# Patient Record
Sex: Female | Born: 1980
Health system: Southern US, Community
[De-identification: ages and names within clinical notes are randomized; demographics above are authoritative.]

## PROBLEM LIST (undated history)

## (undated) DIAGNOSIS — B977 Papillomavirus as the cause of diseases classified elsewhere: Secondary | ICD-10-CM

## (undated) DIAGNOSIS — I10 Essential (primary) hypertension: Secondary | ICD-10-CM

## (undated) DIAGNOSIS — E785 Hyperlipidemia, unspecified: Secondary | ICD-10-CM

## (undated) DIAGNOSIS — K859 Acute pancreatitis without necrosis or infection, unspecified: Secondary | ICD-10-CM

## (undated) DIAGNOSIS — K2970 Gastritis, unspecified, without bleeding: Secondary | ICD-10-CM

## (undated) HISTORY — DX: Papillomavirus as the cause of diseases classified elsewhere: B97.7

## (undated) HISTORY — DX: Essential (primary) hypertension: I10

## (undated) HISTORY — DX: Hyperlipidemia, unspecified: E78.5

## (undated) HISTORY — DX: Acute pancreatitis without necrosis or infection, unspecified: K85.90

---

## 2006-12-28 ENCOUNTER — Other Ambulatory Visit: Admission: RE | Admit: 2006-12-28 | Discharge: 2006-12-28 | Payer: Self-pay | Admitting: Unknown Physician Specialty

## 2006-12-28 ENCOUNTER — Encounter (INDEPENDENT_AMBULATORY_CARE_PROVIDER_SITE_OTHER): Payer: Self-pay | Admitting: Unknown Physician Specialty

## 2020-03-15 ENCOUNTER — Encounter: Payer: Self-pay | Admitting: Internal Medicine

## 2020-03-15 ENCOUNTER — Ambulatory Visit (INDEPENDENT_AMBULATORY_CARE_PROVIDER_SITE_OTHER): Payer: 59 | Admitting: Internal Medicine

## 2020-03-15 ENCOUNTER — Other Ambulatory Visit: Payer: Self-pay

## 2020-03-15 VITALS — BP 120/81 | HR 93 | Temp 98.5°F | Resp 16 | Ht 62.0 in | Wt 197.4 lb

## 2020-03-15 DIAGNOSIS — E785 Hyperlipidemia, unspecified: Secondary | ICD-10-CM

## 2020-03-15 DIAGNOSIS — B9689 Other specified bacterial agents as the cause of diseases classified elsewhere: Secondary | ICD-10-CM

## 2020-03-15 DIAGNOSIS — F1020 Alcohol dependence, uncomplicated: Secondary | ICD-10-CM | POA: Insufficient documentation

## 2020-03-15 DIAGNOSIS — Z0000 Encounter for general adult medical examination without abnormal findings: Secondary | ICD-10-CM | POA: Insufficient documentation

## 2020-03-15 DIAGNOSIS — Z7689 Persons encountering health services in other specified circumstances: Secondary | ICD-10-CM | POA: Insufficient documentation

## 2020-03-15 DIAGNOSIS — N3000 Acute cystitis without hematuria: Secondary | ICD-10-CM | POA: Diagnosis not present

## 2020-03-15 DIAGNOSIS — F1010 Alcohol abuse, uncomplicated: Secondary | ICD-10-CM | POA: Insufficient documentation

## 2020-03-15 DIAGNOSIS — Z124 Encounter for screening for malignant neoplasm of cervix: Secondary | ICD-10-CM

## 2020-03-15 DIAGNOSIS — D869 Sarcoidosis, unspecified: Secondary | ICD-10-CM

## 2020-03-15 DIAGNOSIS — E669 Obesity, unspecified: Secondary | ICD-10-CM

## 2020-03-15 DIAGNOSIS — Z720 Tobacco use: Secondary | ICD-10-CM

## 2020-03-15 DIAGNOSIS — N760 Acute vaginitis: Secondary | ICD-10-CM

## 2020-03-15 DIAGNOSIS — I10 Essential (primary) hypertension: Secondary | ICD-10-CM | POA: Insufficient documentation

## 2020-03-15 LAB — POCT URINALYSIS DIP (CLINITEK)
Bilirubin, UA: NEGATIVE
Blood, UA: NEGATIVE
Glucose, UA: NEGATIVE mg/dL
Ketones, POC UA: NEGATIVE mg/dL
Leukocytes, UA: NEGATIVE
Nitrite, UA: NEGATIVE
POC PROTEIN,UA: NEGATIVE
Spec Grav, UA: 1.025 (ref 1.010–1.025)
Urobilinogen, UA: 0.2 E.U./dL
pH, UA: 5.5 (ref 5.0–8.0)

## 2020-03-15 MED ORDER — METRONIDAZOLE 500 MG PO TABS: 2000.0000 mg | ORAL_TABLET | Freq: Once | ORAL | 0 refills | Status: AC

## 2020-03-15 NOTE — Assessment & Plan Note (Signed)
Diet modification and moderate exercise advised. 

## 2020-03-15 NOTE — Assessment & Plan Note (Signed)
Care established Previous chart reviewed History and medications reviewed with the patient 

## 2020-03-15 NOTE — Assessment & Plan Note (Signed)
Takes 5 drinks of wine every other day H/o recurrent pancreatitis, f/u CMP Advised to cut down

## 2020-03-15 NOTE — Patient Instructions (Signed)
Please increase fluid intake to at least 2 l in a day.  Please take the medications as prescribed.  Please quit smoking as soon as possible. Please cut down alcohol intake as discussed.  Please follow up with OB/GYN for PAP smear.  Please follow DASH diet and perform moderate exercise/walking at least 150 mins/week.  DASH stands for Dietary Approaches to Stop Hypertension. The DASH diet is a healthy-eating plan designed to help treat or prevent high blood pressure (hypertension).  The DASH diet includes foods that are rich in potassium, calcium and magnesium. These nutrients help control blood pressure. The diet limits foods that are high in sodium, saturated fat and added sugars.  Studies have shown that the DASH diet can lower blood pressure in as little as two weeks. The diet can also lower low-density lipoprotein (LDL or "bad") cholesterol levels in the blood. High blood pressure and high LDL cholesterol levels are two major risk factors for heart disease and stroke.    DASH diet: Recommended servings The DASH diet provides daily and weekly nutritional goals. The number of servings you should have depends on your daily calorie needs.  Here's a look at the recommended servings from each food group for a 2,000-calorie-a-day DASH diet:  Grains: 6 to 8 servings a day. One serving is one slice bread, 1 ounce dry cereal, or 1/2 cup cooked cereal, rice or pasta. Vegetables: 4 to 5 servings a day. One serving is 1 cup raw leafy green vegetable, 1/2 cup cut-up raw or cooked vegetables, or 1/2 cup vegetable juice. Fruits: 4 to 5 servings a day. One serving is one medium fruit, 1/2 cup fresh, frozen or canned fruit, or 1/2 cup fruit juice. Fat-free or low-fat dairy products: 2 to 3 servings a day. One serving is 1 cup milk or yogurt, or 1 1/2 ounces cheese. Lean meats, poultry and fish: six 1-ounce servings or fewer a day. One serving is 1 ounce cooked meat, poultry or fish, or 1 egg. Nuts,  seeds and legumes: 4 to 5 servings a week. One serving is 1/3 cup nuts, 2 tablespoons peanut butter, 2 tablespoons seeds, or 1/2 cup cooked legumes (dried beans or peas). Fats and oils: 2 to 3 servings a day. One serving is 1 teaspoon soft margarine, 1 teaspoon vegetable oil, 1 tablespoon mayonnaise or 2 tablespoons salad dressing. Sweets and added sugars: 5 servings or fewer a week. One serving is 1 tablespoon sugar, jelly or jam, 1/2 cup sorbet, or 1 cup lemonade.

## 2020-03-15 NOTE — Assessment & Plan Note (Signed)
On Fenofibrate Check lipid profile

## 2020-03-15 NOTE — Progress Notes (Signed)
New Patient Office Visit  Subjective:  Patient ID: Tracy Ortiz, female    DOB: 1980-10-05  Age: 39 y.o. MRN: [id number]  CC:  Chief Complaint  Patient presents with  . 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  She states that she has been in good health overall.  She works from home.  BP is well-controlled. Takes medications regularly. Patient denies headache, dizziness, chest pain, dyspnea or palpitations.  She complains of dysuria for the last 2 weeks, with urinary frequency.  She has been trying to increase water intake and try taking cranberry juice which helped her with her dysuria and frequency.  She also complains of thin, whitish vaginal discharge for last 2 days.  She denies any pelvic pain, fever, chills, nausea or vomiting.  She denies any constipation or diarrhea.  She has a history of alcohol abuse, about 5 drinks every other day.  She is willing to cut down to 1 drink per day on average.  Of note, she admits that she has a history of recurrent pancreatitis due to alcohol abuse.  She smokes about 6 cigarettes a day, and is willing to cut down.  She reports history of sarcoidosis, and used to follow-up with pulmonologist in the past.  She requested referral for it.  She is due for Pap smear.  OB/GYN referral provided.  She denies taking Covid or flu vaccine despite explaining benefits.   History reviewed. No pertinent past medical history.  History reviewed. No pertinent surgical history.  History reviewed. No pertinent family history.  Social History   Socioeconomic History  . Marital status: Single    Spouse name: Not on file  . Number of children: Not on file  . Years  of education: Not on file  . Highest education level: Not on file  Occupational History  . Not on file  Tobacco Use  . Smoking status: Current Every Day Smoker  . Smokeless tobacco: Never Used  Substance and Sexual Activity  . Alcohol use: Yes    Alcohol/week: 5.0 standard drinks    Types: 5 Glasses of wine per week    Comment: every other day   . Drug use: Never  . Sexual activity: Not on file  Other Topics Concern  . Not on file  Social History Narrative  . Not on file   Social Determinants of Health   Financial Resource Strain:   . Difficulty of Paying Living Expenses: Not on file  Food Insecurity:   . Worried About Charity fundraiser in the Last Year: Not on file  . Ran Out of Food in the Last Year: Not on file  Transportation Needs:   . Lack of Transportation (Medical): Not on file  . Lack of Transportation (Non-Medical): Not on file  Physical Activity:   . Days of Exercise per Week: Not on file  . Minutes of Exercise per Session: Not on file  Stress:   . Feeling of Stress : Not on file  Social Connections:   . Frequency of Communication with Friends and Family: Not on file  . Frequency of Social Gatherings with Friends and Family: Not on file  . Attends Religious Services: Not on file  .  Active Member of Clubs or Organizations: Not on file  . Attends Archivist Meetings: Not on file  . Marital Status: Not on file  Intimate Partner Violence:   . Fear of Current or Ex-Partner: Not on file  . Emotionally Abused: Not on file  . Physically Abused: Not on file  . Sexually Abused: Not on file    ROS Review of Systems  Constitutional: Negative for chills and fever.  HENT: Negative for congestion, sinus pressure, sinus pain and sore throat.   Eyes: Negative for pain and discharge.  Respiratory: Negative for cough and shortness of breath.   Cardiovascular: Negative for chest pain and palpitations.  Gastrointestinal: Negative for abdominal pain,  constipation, diarrhea, nausea and vomiting.  Endocrine: Negative for polydipsia and polyuria.  Genitourinary: Positive for vaginal discharge. Negative for dysuria, hematuria and vaginal pain.  Musculoskeletal: Negative for neck pain and neck stiffness.  Skin: Negative for rash.  Neurological: Negative for dizziness and weakness.  Psychiatric/Behavioral: Negative for agitation and behavioral problems.    Objective:   Today's Vitals: BP 120/81 (BP Location: Right Arm, Patient Position: Sitting, Cuff Size: Normal)   Pulse 93   Temp 98.5 F (36.9 C) (Oral)   Resp 16   Ht _0  (1.575 m)   Wt 197 lb 6.4 oz (89.5 kg)   SpO2 98%   BMI 36.10 kg/m   Physical Exam Vitals reviewed.  Constitutional:      General: She is not in acute distress.    Appearance: She is obese. She is not diaphoretic.  HENT:     Head: Normocephalic and atraumatic.     Nose: Nose normal.     Mouth/Throat:     Mouth: Mucous membranes are moist.  Eyes:     General: No scleral icterus.    Extraocular Movements: Extraocular movements intact.     Pupils: Pupils are equal, round, and reactive to light.  Cardiovascular:     Rate and Rhythm: Normal rate and regular rhythm.     Pulses: Normal pulses.     Heart sounds: No murmur heard.   Pulmonary:     Breath sounds: Normal breath sounds. No wheezing or rales.  Abdominal:     Palpations: Abdomen is soft.     Tenderness: There is no abdominal tenderness.  Musculoskeletal:     Cervical back: Neck supple. No tenderness.     Right lower leg: No edema.     Left lower leg: No edema.  Skin:    General: Skin is warm.     Findings: No rash.  Neurological:     General: No focal deficit present.     Mental Status: She is alert and oriented to person, place, and time.     Sensory: No sensory deficit.     Motor: No weakness.  Psychiatric:        Mood and Affect: Mood normal.        Behavior: Behavior normal.     Assessment & Plan:   Problem List Items  Addressed This Visit      Encounter to establish care - Primary   Care established Previous chart reviewed History and medications reviewed with the patient     Relevant Orders  CBC  CMP14+EGFR  Hemoglobin A1c  Lipid panel  VITAMIN D 25 Hydroxy (Vit-D Deficiency, Fractures)  Vitamin D 1,25 dihydroxy  TSH + free T4    Cardiovascular and Mediastinum   Primary hypertension    BP Readings from Last 1  Encounters:  03/15/20 120/81   Well-controlled with Losartan Counseled for compliance with the medications Advised DASH diet and moderate exercise/walking, at least 150 mins/week       Relevant Medications   fenofibrate (TRICOR) 145 MG tablet   losartan (COZAAR) 25 MG tablet     Genitourinary   Bacterial vaginosis    Thin, whitish discharge Flagyl 2 gm once      Relevant Medications   metroNIDAZOLE (FLAGYL) 500 MG tablet     Other      Hyperlipidemia    On Fenofibrate Check lipid profile      Relevant Medications   fenofibrate (TRICOR) 145 MG tablet   losartan (COZAAR) 25 MG tablet   Sarcoid    Not on any medications currently Pulmonology referral provided as per patient request      Relevant Orders   Ambulatory referral to Pulmonology   Tobacco abuse    Asked about quitting: confirms that she currently smokes cigarettes Advise to quit smoking: Educated about QUITTING to reduce the risk of cancer, cardio and cerebrovascular disease. Assess willingness: Unwilling to quit at this time, but is working on cutting back. Assist with counseling and pharmacotherapy: Counseled for 5 minutes and literature provided. Arrange for follow up: If not quitting follow up in 3 months and continue to offer help.      Alcohol abuse    Takes 5 drinks of wine every other day H/o recurrent pancreatitis, f/u CMP Advised to cut down      Obesity (BMI 30-39.9)    Diet modification and moderate exercise advised       Other Visit Diagnoses    Acute cystitis without  hematuria       Relevant Orders   POCT URINALYSIS DIP (CLINITEK) (Completed)   Routine cervical smear       Relevant Orders   Ambulatory referral to Obstetrics / Gynecology      Outpatient Encounter Medications as of 03/15/2020  Medication Sig  . fenofibrate (TRICOR) 145 MG tablet Take 145 mg by mouth daily.  Marland Kitchen losartan (COZAAR) 25 MG tablet Take 25 mg by mouth daily.  . metroNIDAZOLE (FLAGYL) 500 MG tablet Take 4 tablets (2,000 mg total) by mouth once for 1 dose.   No facility-administered encounter medications on file as of 03/15/2020.    Follow-up: Return in about 3 months (around 06/15/2020), or Annual physical exam.   Lindell Spar, MD

## 2020-03-15 NOTE — Assessment & Plan Note (Signed)
Not on any medications currently Pulmonology referral provided as per patient request

## 2020-03-15 NOTE — Assessment & Plan Note (Signed)
Asked about quitting: confirms that she currently smokes cigarettes Advise to quit smoking: Educated about QUITTING to reduce the risk of cancer, cardio and cerebrovascular disease. Assess willingness: Unwilling to quit at this time, but is working on cutting back. Assist with counseling and pharmacotherapy: Counseled for 5 minutes and literature provided. Arrange for follow up: If not quitting follow up in 3 months and continue to offer help. 

## 2020-03-15 NOTE — Assessment & Plan Note (Signed)
BP Readings from Last 1 Encounters:  03/15/20 120/81   Well-controlled with Losartan Counseled for compliance with the medications Advised DASH diet and moderate exercise/walking, at least 150 mins/week

## 2020-03-15 NOTE — Assessment & Plan Note (Signed)
Thin, whitish discharge Flagyl 2 gm once

## 2020-03-24 LAB — CBC
Hematocrit: 34.9 % (ref 34.0–46.6)
Hemoglobin: 11.8 g/dL (ref 11.1–15.9)
MCH: 30.4 pg (ref 26.6–33.0)
MCHC: 33.8 g/dL (ref 31.5–35.7)
MCV: 90 fL (ref 79–97)
Platelets: 423 10*3/uL (ref 150–450)
RBC: 3.88 x10E6/uL (ref 3.77–5.28)
RDW: 17.2 % — ABNORMAL HIGH (ref 11.7–15.4)
WBC: 4.9 10*3/uL (ref 3.4–10.8)

## 2020-03-24 LAB — CMP14+EGFR
ALT: 14 IU/L (ref 0–32)
AST: 17 IU/L (ref 0–40)
Albumin/Globulin Ratio: 1.7 (ref 1.2–2.2)
Albumin: 4.3 g/dL (ref 3.8–4.8)
Alkaline Phosphatase: 47 IU/L (ref 44–121)
BUN/Creatinine Ratio: 16 (ref 9–23)
BUN: 12 mg/dL (ref 6–20)
Bilirubin Total: <0.2 mg/dL (ref 0.0–1.2)
CO2: 22 mmol/L (ref 20–29)
Calcium: 9.5 mg/dL (ref 8.7–10.2)
Chloride: 107 mmol/L — ABNORMAL HIGH (ref 96–106)
Creatinine, Ser: 0.77 mg/dL (ref 0.57–1.00)
GFR calc Af Amer: 115 mL/min/{1.73_m2} (ref >59)
GFR calc non Af Amer: 100 mL/min/{1.73_m2} (ref >59)
Globulin, Total: 2.6 g/dL (ref 1.5–4.5)
Glucose: 118 mg/dL — ABNORMAL HIGH (ref 65–99)
Potassium: 4.6 mmol/L (ref 3.5–5.2)
Sodium: 145 mmol/L — ABNORMAL HIGH (ref 134–144)
Total Protein: 6.9 g/dL (ref 6.0–8.5)

## 2020-03-24 LAB — LIPID PANEL
Chol/HDL Ratio: 5.5 ratio — ABNORMAL HIGH (ref 0.0–4.4)
Cholesterol, Total: 186 mg/dL (ref 100–199)
HDL: 34 mg/dL — ABNORMAL LOW (ref >39)
LDL Chol Calc (NIH): 72 mg/dL (ref 0–99)
Triglycerides: 511 mg/dL — ABNORMAL HIGH (ref 0–149)
VLDL Cholesterol Cal: 80 mg/dL — ABNORMAL HIGH (ref 5–40)

## 2020-03-24 LAB — VITAMIN D 1,25 DIHYDROXY
Vitamin D 1, 25 (OH)2 Total: 70 pg/mL — ABNORMAL HIGH
Vitamin D2 1, 25 (OH)2: <10 pg/mL
Vitamin D3 1, 25 (OH)2: 70 pg/mL

## 2020-03-24 LAB — HEMOGLOBIN A1C
Est. average glucose Bld gHb Est-mCnc: 123 mg/dL
Hgb A1c MFr Bld: 5.9 % — ABNORMAL HIGH (ref 4.8–5.6)

## 2020-03-24 LAB — VITAMIN D 25 HYDROXY (VIT D DEFICIENCY, FRACTURES): Vit D, 25-Hydroxy: 35.8 ng/mL (ref 30.0–100.0)

## 2020-03-24 LAB — TSH+FREE T4
Free T4: 1.01 ng/dL (ref 0.82–1.77)
TSH: 1.06 u[IU]/mL (ref 0.450–4.500)

## 2020-05-08 ENCOUNTER — Encounter: Payer: 59 | Admitting: Adult Health

## 2020-06-14 ENCOUNTER — Other Ambulatory Visit: Payer: Self-pay

## 2020-06-14 ENCOUNTER — Encounter: Payer: Self-pay | Admitting: Internal Medicine

## 2020-06-14 ENCOUNTER — Other Ambulatory Visit (HOSPITAL_COMMUNITY)
Admission: RE | Admit: 2020-06-14 | Discharge: 2020-06-14 | Disposition: A | Discharge status: Home / Self Care | Payer: 59 | Source: Ambulatory Visit | Attending: Adult Health | Admitting: Adult Health

## 2020-06-14 ENCOUNTER — Ambulatory Visit (INDEPENDENT_AMBULATORY_CARE_PROVIDER_SITE_OTHER): Payer: 59 | Admitting: Adult Health

## 2020-06-14 ENCOUNTER — Ambulatory Visit (INDEPENDENT_AMBULATORY_CARE_PROVIDER_SITE_OTHER): Payer: 59 | Admitting: Internal Medicine

## 2020-06-14 ENCOUNTER — Encounter: Payer: Self-pay | Admitting: Adult Health

## 2020-06-14 VITALS — BP 151/105 | HR 92 | Ht 62.0 in | Wt 195.4 lb

## 2020-06-14 VITALS — BP 135/99 | HR 90 | Resp 18 | Ht 62.0 in | Wt 195.0 lb

## 2020-06-14 DIAGNOSIS — Z01419 Encounter for gynecological examination (general) (routine) without abnormal findings: Secondary | ICD-10-CM

## 2020-06-14 DIAGNOSIS — Z0000 Encounter for general adult medical examination without abnormal findings: Secondary | ICD-10-CM

## 2020-06-14 DIAGNOSIS — K859 Acute pancreatitis without necrosis or infection, unspecified: Secondary | ICD-10-CM | POA: Diagnosis not present

## 2020-06-14 DIAGNOSIS — R7303 Prediabetes: Secondary | ICD-10-CM

## 2020-06-14 DIAGNOSIS — K861 Other chronic pancreatitis: Secondary | ICD-10-CM | POA: Insufficient documentation

## 2020-06-14 DIAGNOSIS — E785 Hyperlipidemia, unspecified: Secondary | ICD-10-CM | POA: Diagnosis not present

## 2020-06-14 DIAGNOSIS — B977 Papillomavirus as the cause of diseases classified elsewhere: Secondary | ICD-10-CM | POA: Insufficient documentation

## 2020-06-14 DIAGNOSIS — I10 Essential (primary) hypertension: Secondary | ICD-10-CM | POA: Diagnosis not present

## 2020-06-14 DIAGNOSIS — K219 Gastro-esophageal reflux disease without esophagitis: Secondary | ICD-10-CM

## 2020-06-14 DIAGNOSIS — N926 Irregular menstruation, unspecified: Secondary | ICD-10-CM | POA: Diagnosis not present

## 2020-06-14 MED ORDER — LOSARTAN POTASSIUM 50 MG PO TABS
50.0000 mg | ORAL_TABLET | Freq: Every day | ORAL | 1 refills | Status: DC
Start: 2020-06-14 — End: 2020-10-21

## 2020-06-14 MED ORDER — ROSUVASTATIN CALCIUM 10 MG PO TABS: 10.0000 mg | ORAL_TABLET | Freq: Every day | ORAL | 0 refills | Status: DC

## 2020-06-14 MED ORDER — FAMOTIDINE 20 MG PO TABS: 20.0000 mg | ORAL_TABLET | Freq: Two times a day (BID) | ORAL | 2 refills | Status: DC

## 2020-06-14 NOTE — Assessment & Plan Note (Signed)
Annual exam as documented. Counseling done  re healthy lifestyle involving commitment to 150 minutes exercise per week, heart healthy diet, and attaining healthy weight.The importance of adequate sleep also discussed. Changes in health habits are decided on by the patient with goals and time frames  set for achieving them. Immunization and cancer screening needs are specifically addressed at this visit. 

## 2020-06-14 NOTE — Assessment & Plan Note (Signed)
Related to alcohol use and/or hypertriglyceridemia Check lipase as she c/o mild abdominal pain after alcohol intake Advised to increase water intake Soft diet, advance as tolerated

## 2020-06-14 NOTE — Assessment & Plan Note (Signed)
Started Pepcid 

## 2020-06-14 NOTE — Progress Notes (Signed)
  Subjective:     Patient ID: Tracy Ortiz, female   DOB: 1980/06/17, 40 y.o.   MRN: [id number]  HPI Tracy Ortiz is a 40 year old black female,married, G0P0, in for pap and pelvic, she a physical and labs with Dr Allena Katz this morning. PCP is Dr Allena Katz.   Review of Systems    Patient denies any headaches, hearing loss, fatigue, blurred vision, shortness of breath, chest pain, abdominal pain, problems with bowel movements, urination, or intercourse. No joint pain or mood swings. Does have irregular periods,may skip months and then the period will be heavy  Reviewed past medical,surgical, social and family history. Reviewed medications and allergies.  Objective:   Physical Exam BP (!) 151/105 (BP Location: Right Arm, Patient Position: Sitting, Cuff Size: Large)   Pulse 92   Ht 5\' 2"  (1.575 m)   Wt 195 lb 6.4 oz (88.6 kg)   LMP 05/21/2020   BMI 35.74 kg/m  Skin warm and dry.Pelvic: external genitalia is normal in appearance no lesions, vagina: creamy discharge without odor,urethra has no lesions or masses noted, cervix:smooth, pap with GC/CHL and HRHPV genotyping performed, uterus: normal size, shape and contour, non tender, no masses felt, adnexa: no masses or tenderness noted. Bladder is non tender and no masses felt. She did not take BP med today      Upstream - 06/14/20 1219      Pregnancy Intention Screening   Does the patient want to become pregnant in the next year? No    Does the patient's partner want to become pregnant in the next year? No    Would the patient like to discuss contraceptive options today? No      Contraception Wrap Up   Current Method Female Condom    End Method Female Condom    Contraception Counseling Provided No         Examination chaperoned by [id number] LPN Assessment:     1. Encounter for gynecological examination with Papanicolaou smear of cervix Pap sent If normal pap in 3 years Physical and labs with PCP Mammogram at 42  2. Irregular  periods Discussed if misses over 3 periods call me, will try to regulate cycle     Plan:     Pap in 3 years  if normal

## 2020-06-14 NOTE — Patient Instructions (Signed)
Please increase water intake to at least 2 - 2.5 liters in a day until the abdominal pain resolves.  Please refrain from alcohol use.  Please follow soft diet for about 2 days to help with the symptoms. Avoid hot and spicy food. Start taking Pepcid for acid reflux.  Please start taking Losartan 50 mg once daily.

## 2020-06-14 NOTE — Assessment & Plan Note (Signed)
BP Readings from Last 1 Encounters:  06/14/20 (!) 135/99   Uncontrolled with Losartan 25 mg QD Increased to Losartan 50 mg QD Counseled for compliance with the medications Advised DASH diet and moderate exercise/walking, at least 150 mins/week

## 2020-06-14 NOTE — Progress Notes (Signed)
Established Patient Office Visit  Subjective:  Patient ID: Tracy Ortiz, female    DOB: 1981-03-20  Age: 40 y.o. MRN: [id number]  CC:  Chief Complaint  Patient presents with  . Annual Exam    Annual exam pain in abdomen middle of the abdomen goes down to back worse at night has been taking tylenol still feels uncomfortable also needs refill losartan     HPI Tracy Ortiz is a 40 year old female with past medical history of hypertension, sarcoidosis, hyperlipidemia, alcohol and tobacco abuse who presents for annual physical exam.  She c/o mild epigastric pain radiating to back, which started about 3 days ago. She had about 5 alcoholic drinks a day before her symptoms started. She denies any nausea or vomiting. She has had multiple episodes of pancreatitis in the past.  Her BP is elevated on multiple measurements in the office today - 158/100 and 135/99. She denies headache, dizziness, chest pain, dyspnea or palpitations.  She still has not had Pulmonology visit for sarcoidosis. She needs to contact her Pulmonologist at [place].  Past Medical History:  Diagnosis Date  . Hyperlipidemia   . Hypertension   . Pancreatitis     History reviewed. No pertinent surgical history.  History reviewed. No pertinent family history.  Social History   Socioeconomic History  . Marital status: Single    Spouse name: Not on file  . Number of children: Not on file  . Years of education: Not on file  . Highest education level: Not on file  Occupational History  . Not on file  Tobacco Use  . Smoking status: Current Every Day Smoker  . Smokeless tobacco: Never Used  Substance and Sexual Activity  . Alcohol use: Yes    Alcohol/week: 5.0 standard drinks    Types: 5 Glasses of wine per week    Comment: every other day   . Drug use: Never  . Sexual activity: Not on file  Other Topics Concern  . Not on file  Social History Narrative  . Not on file   Social Determinants of Health    Financial Resource Strain: Not on file  Food Insecurity: Not on file  Transportation Needs: Not on file  Physical Activity: Not on file  Stress: Not on file  Social Connections: Not on file  Intimate Partner Violence: Not on file    Outpatient Medications Prior to Visit  Medication Sig Dispense Refill  . ondansetron (ZOFRAN-ODT) 4 MG disintegrating tablet Take by mouth.    . losartan (COZAAR) 25 MG tablet Take 25 mg by mouth daily.    . fenofibrate (TRICOR) 145 MG tablet Take 145 mg by mouth daily. (Patient not taking: Reported on 06/14/2020)     No facility-administered medications prior to visit.    Not on File  ROS Review of Systems  Constitutional: Negative for chills and fever.  HENT: Negative for congestion, sinus pressure, sinus pain and sore throat.   Eyes: Negative for pain and discharge.  Respiratory: Negative for cough and shortness of breath.   Cardiovascular: Negative for chest pain and palpitations.  Gastrointestinal: Positive for abdominal pain. Negative for constipation, diarrhea, nausea and vomiting.  Endocrine: Negative for polydipsia and polyuria.  Genitourinary: Negative for dysuria, hematuria, vaginal discharge and vaginal pain.  Musculoskeletal: Negative for neck pain and neck stiffness.  Skin: Negative for rash.  Neurological: Negative for dizziness and weakness.  Psychiatric/Behavioral: Negative for agitation and behavioral problems.      Objective:    Physical Exam  Vitals reviewed.  Constitutional:      General: She is not in acute distress.    Appearance: She is obese. She is not diaphoretic.  HENT:     Head: Normocephalic and atraumatic.     Nose: Nose normal.     Mouth/Throat:     Mouth: Mucous membranes are moist.  Eyes:     General: No scleral icterus.    Extraocular Movements: Extraocular movements intact.     Pupils: Pupils are equal, round, and reactive to light.  Cardiovascular:     Rate and Rhythm: Normal rate and regular  rhythm.     Pulses: Normal pulses.     Heart sounds: Normal heart sounds. No murmur heard.   Pulmonary:     Breath sounds: Normal breath sounds. No wheezing or rales.  Abdominal:     General: Bowel sounds are normal.     Palpations: Abdomen is soft.     Tenderness: There is abdominal tenderness (Mild epigastric). There is no guarding or rebound.  Musculoskeletal:     Cervical back: Neck supple. No tenderness.     Right lower leg: No edema.     Left lower leg: No edema.  Skin:    General: Skin is warm.     Findings: No rash.  Neurological:     General: No focal deficit present.     Mental Status: She is alert and oriented to person, place, and time.     Sensory: No sensory deficit.     Motor: No weakness.  Psychiatric:        Mood and Affect: Mood normal.        Behavior: Behavior normal.     BP (!) 135/99 (BP Location: Left Arm, Patient Position: Sitting)   Pulse 90   Resp 18   Ht 5' 2"  (1.575 m)   Wt 195 lb (88.5 kg)   SpO2 98%   BMI 35.67 kg/m  Wt Readings from Last 3 Encounters:  06/14/20 195 lb (88.5 kg)  03/15/20 197 lb 6.4 oz (89.5 kg)     Health Maintenance Due  Topic Date Due  . Hepatitis C Screening  Never done  . HIV Screening  Never done  . TETANUS/TDAP  Never done  . PAP SMEAR-Modifier  Never done    There are no preventive care reminders to display for this patient.  Lab Results  Component Value Date   TSH 1.060 03/15/2020   Lab Results  Component Value Date   WBC 4.9 03/15/2020   HGB 11.8 03/15/2020   HCT 34.9 03/15/2020   MCV 90 03/15/2020   PLT 423 03/15/2020   Lab Results  Component Value Date   NA 145 (H) 03/15/2020   K 4.6 03/15/2020   CO2 22 03/15/2020   GLUCOSE 118 (H) 03/15/2020   BUN 12 03/15/2020   CREATININE 0.77 03/15/2020   BILITOT <0.2 03/15/2020   ALKPHOS 47 03/15/2020   AST 17 03/15/2020   ALT 14 03/15/2020   PROT 6.9 03/15/2020   ALBUMIN 4.3 03/15/2020   CALCIUM 9.5 03/15/2020   Lab Results  Component  Value Date   CHOL 186 03/15/2020   Lab Results  Component Value Date   HDL 34 (L) 03/15/2020   Lab Results  Component Value Date   LDLCALC 72 03/15/2020   Lab Results  Component Value Date   TRIG 511 (H) 03/15/2020   Lab Results  Component Value Date   CHOLHDL 5.5 (H) 03/15/2020   Lab Results  Component Value Date   HGBA1C 5.9 (H) 03/15/2020      Assessment & Plan:   Problem List Items Addressed This Visit      Annual physical exam - Primary   Annual exam as documented. Counseling done  re healthy lifestyle involving commitment to 150 minutes exercise per week, heart healthy diet, and attaining healthy weight.The importance of adequate sleep also discussed. Changes in health habits are decided on by the patient with goals and time frames  set for achieving them. Immunization and cancer screening needs are specifically addressed at this visit.        Cardiovascular and Mediastinum   Primary hypertension    BP Readings from Last 1 Encounters:  06/14/20 (!) 135/99   Uncontrolled with Losartan 25 mg QD Increased to Losartan 50 mg QD Counseled for compliance with the medications Advised DASH diet and moderate exercise/walking, at least 150 mins/week       Relevant Medications   rosuvastatin (CRESTOR) 10 MG tablet   losartan (COZAAR) 50 MG tablet     Digestive   Recurrent pancreatitis    Related to alcohol use and/or hypertriglyceridemia Check lipase as she c/o mild abdominal pain after alcohol intake Advised to increase water intake Soft diet, advance as tolerated      Relevant Medications   famotidine (PEPCID) 20 MG tablet   Other Relevant Orders   CMP14+EGFR   Lipase   Ambulatory referral to Gastroenterology   Gastroesophageal reflux disease    Started Pepcid      Relevant Medications   ondansetron (ZOFRAN-ODT) 4 MG disintegrating tablet   famotidine (PEPCID) 20 MG tablet     Other         Hyperlipidemia    Lipid profile reviewed Started  Crestor instead of Fenofibrate Plan to add Vascepa if uncontrolled TG      Relevant Medications   rosuvastatin (CRESTOR) 10 MG tablet   losartan (COZAAR) 50 MG tablet   Other Relevant Orders   Lipid Profile   Prediabetes    Check HbA1C Follow low carbohydrate diet      Relevant Orders   Hemoglobin A1c      Meds ordered this encounter  Medications  . rosuvastatin (CRESTOR) 10 MG tablet    Sig: Take 1 tablet (10 mg total) by mouth daily.    Dispense:  90 tablet    Refill:  0  . famotidine (PEPCID) 20 MG tablet    Sig: Take 1 tablet (20 mg total) by mouth 2 (two) times daily.    Dispense:  60 tablet    Refill:  2  . losartan (COZAAR) 50 MG tablet    Sig: Take 1 tablet (50 mg total) by mouth daily.    Dispense:  30 tablet    Refill:  1    Follow-up: Return in about 6 weeks (around 07/26/2020) for HTN.    Lindell Spar, MD

## 2020-06-14 NOTE — Assessment & Plan Note (Signed)
Check HbA1C Follow low carbohydrate diet

## 2020-06-14 NOTE — Assessment & Plan Note (Signed)
Lipid profile reviewed Started Crestor instead of Fenofibrate Plan to add Vascepa if uncontrolled TG

## 2020-06-15 LAB — CMP14+EGFR
ALT: 14 IU/L (ref 0–32)
AST: 18 IU/L (ref 0–40)
Albumin/Globulin Ratio: 1.6 (ref 1.2–2.2)
Albumin: 4.5 g/dL (ref 3.8–4.8)
Alkaline Phosphatase: 57 IU/L (ref 44–121)
BUN/Creatinine Ratio: 8 — ABNORMAL LOW (ref 9–23)
BUN: 6 mg/dL (ref 6–20)
Bilirubin Total: 0.3 mg/dL (ref 0.0–1.2)
CO2: 19 mmol/L — ABNORMAL LOW (ref 20–29)
Calcium: 9.2 mg/dL (ref 8.7–10.2)
Chloride: 103 mmol/L (ref 96–106)
Creatinine, Ser: 0.76 mg/dL (ref 0.57–1.00)
GFR calc Af Amer: 116 mL/min/{1.73_m2} (ref >59)
GFR calc non Af Amer: 101 mL/min/{1.73_m2} (ref >59)
Globulin, Total: 2.8 g/dL (ref 1.5–4.5)
Glucose: 115 mg/dL — ABNORMAL HIGH (ref 65–99)
Potassium: 4.1 mmol/L (ref 3.5–5.2)
Sodium: 140 mmol/L (ref 134–144)
Total Protein: 7.3 g/dL (ref 6.0–8.5)

## 2020-06-15 LAB — LIPASE: Lipase: 60 U/L (ref 14–72)

## 2020-06-15 LAB — LIPID PANEL
Chol/HDL Ratio: 6.2 ratio — ABNORMAL HIGH (ref 0.0–4.4)
Cholesterol, Total: 217 mg/dL — ABNORMAL HIGH (ref 100–199)
HDL: 35 mg/dL — ABNORMAL LOW (ref >39)
LDL Chol Calc (NIH): 119 mg/dL — ABNORMAL HIGH (ref 0–99)
Triglycerides: 356 mg/dL — ABNORMAL HIGH (ref 0–149)
VLDL Cholesterol Cal: 63 mg/dL — ABNORMAL HIGH (ref 5–40)

## 2020-06-15 LAB — HEMOGLOBIN A1C
Est. average glucose Bld gHb Est-mCnc: 123 mg/dL
Hgb A1c MFr Bld: 5.9 % — ABNORMAL HIGH (ref 4.8–5.6)

## 2020-06-17 ENCOUNTER — Telehealth: Payer: Self-pay

## 2020-06-17 ENCOUNTER — Encounter: Payer: Self-pay | Admitting: Internal Medicine

## 2020-06-17 NOTE — Telephone Encounter (Signed)
Patient called back with more lab questions. Please contact patient # [phone].

## 2020-06-17 NOTE — Telephone Encounter (Signed)
Pt informed of lab results with verbal understanding  

## 2020-06-17 NOTE — Telephone Encounter (Signed)
Patient returning call to go over her lab work. # [id number].

## 2020-06-17 NOTE — Telephone Encounter (Signed)
Pt notified of lab results

## 2020-06-19 LAB — CYTOLOGY - PAP
Chlamydia: NEGATIVE
Comment: NEGATIVE
Comment: NEGATIVE
Comment: NORMAL
Diagnosis: NEGATIVE
High risk HPV: NEGATIVE
Neisseria Gonorrhea: NEGATIVE

## 2020-07-18 ENCOUNTER — Ambulatory Visit: Payer: 59 | Admitting: Internal Medicine

## 2020-07-26 ENCOUNTER — Ambulatory Visit: Payer: 59 | Admitting: Internal Medicine

## 2020-08-14 ENCOUNTER — Ambulatory Visit: Payer: 59 | Admitting: Internal Medicine

## 2020-08-14 ENCOUNTER — Encounter: Payer: Self-pay | Admitting: Internal Medicine

## 2020-10-21 ENCOUNTER — Other Ambulatory Visit: Payer: Self-pay | Admitting: Internal Medicine

## 2020-10-21 DIAGNOSIS — I10 Essential (primary) hypertension: Secondary | ICD-10-CM

## 2021-01-09 ENCOUNTER — Encounter: Payer: Self-pay | Admitting: Internal Medicine

## 2021-01-09 ENCOUNTER — Ambulatory Visit (INDEPENDENT_AMBULATORY_CARE_PROVIDER_SITE_OTHER): Payer: 59 | Admitting: Internal Medicine

## 2021-01-09 ENCOUNTER — Other Ambulatory Visit: Payer: Self-pay

## 2021-01-09 VITALS — BP 140/86 | HR 84 | Temp 98.2°F | Resp 18 | Ht 62.0 in | Wt 200.1 lb

## 2021-01-09 DIAGNOSIS — Z09 Encounter for follow-up examination after completed treatment for conditions other than malignant neoplasm: Secondary | ICD-10-CM

## 2021-01-09 DIAGNOSIS — K859 Acute pancreatitis without necrosis or infection, unspecified: Secondary | ICD-10-CM | POA: Diagnosis not present

## 2021-01-09 DIAGNOSIS — J0100 Acute maxillary sinusitis, unspecified: Secondary | ICD-10-CM | POA: Diagnosis not present

## 2021-01-09 DIAGNOSIS — I10 Essential (primary) hypertension: Secondary | ICD-10-CM

## 2021-01-09 MED ORDER — LOSARTAN POTASSIUM 50 MG PO TABS: 50.0000 mg | ORAL_TABLET | Freq: Every day | ORAL | 1 refills | Status: DC

## 2021-01-09 MED ORDER — AMOXICILLIN-POT CLAVULANATE 875-125 MG PO TABS: 1.0000 | ORAL_TABLET | Freq: Two times a day (BID) | ORAL | 0 refills | Status: DC

## 2021-01-09 MED ORDER — FLUTICASONE PROPIONATE 50 MCG/ACT NA SUSP: 2.0000 | Freq: Every day | NASAL | 2 refills | Status: DC

## 2021-01-09 NOTE — Patient Instructions (Signed)
Please continue taking Losartan as prescribed.  Please start taking Augmentin for sinusitis. Please use Flonase for nasal congestion and allergic rhinitis.  Please continue to follow DASH diet and perform moderate exercise/walking at least 150 mins/week.

## 2021-01-10 NOTE — Assessment & Plan Note (Signed)
Sinus pain and pressure with nasal congestion Started Augmentin Nasal saline spray PRN

## 2021-01-10 NOTE — Assessment & Plan Note (Signed)
Related to alcohol use and/or hypertriglyceridemia Recent ER visit for pancreatitis, advised to increase fluid intake and avoid alcohol use Soft diet, advance as tolerated

## 2021-01-10 NOTE — Progress Notes (Signed)
Established Patient Office Visit  Subjective:  Patient ID: Tracy Ortiz, female    DOB: 06/12/1980  Age: 40 y.o. MRN: [id number]  CC:  Chief Complaint  Patient presents with   Hypertension   Follow-up    6 week follow up 2 weeks ago seen at [facility] for pancreatitis bp was high pancreatitis fine sinus pressure runny nose no fever or cough since 01/08/21     HPI Tracy Ortiz is a 40 year old female with past medical history of hypertension, sarcoidosis, hyperlipidemia, alcohol and tobacco abuse who presents for f/u of HTN and after recent ER visit.  Her BP was elevated in the office today, but shr has not taken her Losartan today and she had to rush from her workplace to make it to the appointment. She denies headache, dizziness, chest pain, dyspnea or palpitations.  She recently had ER visit about 10 days ago for pancreatitis. She c/o mild epigastric pain radiating to back, which started about 2 days ago prior to the visit. She had few alcoholic drinks a day before her symptoms started. She denies any nausea or vomiting currently. She has had multiple episodes of pancreatitis in the past.   Past Medical History:  Diagnosis Date   HPV (human papilloma virus) infection    Hyperlipidemia    Hypertension    Pancreatitis     History reviewed. No pertinent surgical history.  Family History  Problem Relation Age of Onset   Other Father        healthy   Diabetes Mother    Polycystic ovary syndrome Sister     Social History   Socioeconomic History   Marital status: Married    Spouse name: Not on file   Number of children: Not on file   Years of education: Not on file   Highest education level: Not on file  Occupational History   Not on file  Tobacco Use   Smoking status: Every Day   Smokeless tobacco: Never  Substance and Sexual Activity   Alcohol use: Yes    Alcohol/week: 5.0 standard drinks    Types: 5 Glasses of wine per week    Comment: every other day     Drug use: Never   Sexual activity: Yes    Birth control/protection: Condom  Other Topics Concern   Not on file  Social History Narrative   Not on file   Social Determinants of Health   Financial Resource Strain: Low Risk    Difficulty of Paying Living Expenses: Not hard at all  Food Insecurity: No Food Insecurity   Worried About Programme researcher, broadcasting/film/video in the Last Year: Never true   Ran Out of Food in the Last Year: Never true  Transportation Needs: No Transportation Needs   Lack of Transportation (Medical): No   Lack of Transportation (Non-Medical): No  Physical Activity: Sufficiently Active   Days of Exercise per Week: 3 days   Minutes of Exercise per Session: 60 min  Stress: No Stress Concern Present   Feeling of Stress : Not at all  Social Connections: Socially Integrated   Frequency of Communication with Friends and Family: More than three times a week   Frequency of Social Gatherings with Friends and Family: More than three times a week   Attends Religious Services: More than 4 times per year   Active Member of [employer] or Organizations: No   Attends Engineer, structural: More than 4 times per year   Marital Status: Married  Intimate Partner Violence: Not At Risk   Fear of Current or Ex-Partner: No   Emotionally Abused: No   Physically Abused: No   Sexually Abused: No    Outpatient Medications Prior to Visit  Medication Sig Dispense Refill   famotidine (PEPCID) 20 MG tablet Take 1 tablet (20 mg total) by mouth 2 (two) times daily. 60 tablet 2   ondansetron (ZOFRAN-ODT) 4 MG disintegrating tablet Take by mouth.     rosuvastatin (CRESTOR) 10 MG tablet Take 1 tablet (10 mg total) by mouth daily. 90 tablet 0   losartan (COZAAR) 50 MG tablet Take 1 tablet by mouth once daily 30 tablet 0   No facility-administered medications prior to visit.    Not on File  ROS Review of Systems  Constitutional:  Negative for chills and fever.  HENT:  Negative for congestion,  sinus pressure, sinus pain and sore throat.   Eyes:  Negative for pain and discharge.  Respiratory:  Negative for cough and shortness of breath.   Cardiovascular:  Negative for chest pain and palpitations.  Gastrointestinal:  Negative for abdominal pain, constipation, diarrhea, nausea and vomiting.  Endocrine: Negative for polydipsia and polyuria.  Genitourinary:  Negative for dysuria, hematuria, vaginal discharge and vaginal pain.  Musculoskeletal:  Negative for neck pain and neck stiffness.  Skin:  Negative for rash.  Neurological:  Negative for dizziness and weakness.  Psychiatric/Behavioral:  Negative for agitation and behavioral problems.      Objective:    Physical Exam Vitals reviewed.  Constitutional:      General: She is not in acute distress.    Appearance: She is obese. She is not diaphoretic.  HENT:     Head: Normocephalic and atraumatic.     Nose: Nose normal.     Mouth/Throat:     Mouth: Mucous membranes are moist.  Eyes:     General: No scleral icterus.    Extraocular Movements: Extraocular movements intact.  Cardiovascular:     Rate and Rhythm: Normal rate and regular rhythm.     Pulses: Normal pulses.     Heart sounds: Normal heart sounds. No murmur heard. Pulmonary:     Breath sounds: Normal breath sounds. No wheezing or rales.  Abdominal:     General: Bowel sounds are normal.     Palpations: Abdomen is soft.     Tenderness: There is no abdominal tenderness. There is no guarding or rebound.  Musculoskeletal:     Cervical back: Neck supple. No tenderness.     Right lower leg: No edema.     Left lower leg: No edema.  Skin:    General: Skin is warm.     Findings: No rash.  Neurological:     General: No focal deficit present.     Mental Status: She is alert and oriented to person, place, and time.     Sensory: No sensory deficit.     Motor: No weakness.  Psychiatric:        Mood and Affect: Mood normal.        Behavior: Behavior normal.    BP  140/86 (BP Location: Right Arm, Cuff Size: Normal)   Pulse 84   Temp 98.2 F (36.8 C) (Oral)   Resp 18   Ht 5\' 2"  (1.575 m)   Wt 200 lb 1.9 oz (90.8 kg)   SpO2 98%   BMI 36.60 kg/m  Wt Readings from Last 3 Encounters:  01/09/21 200 lb 1.9 oz (90.8 kg)  06/14/20 195  lb 6.4 oz (88.6 kg)  06/14/20 195 lb (88.5 kg)     Health Maintenance Due  Topic Date Due   COVID-19 Vaccine (1) Never done   Pneumococcal Vaccine 68-63 Years old (1 - PCV) Never done   TETANUS/TDAP  Never done    There are no preventive care reminders to display for this patient.  Lab Results  Component Value Date   TSH 1.060 03/15/2020   Lab Results  Component Value Date   WBC 4.9 03/15/2020   HGB 11.8 03/15/2020   HCT 34.9 03/15/2020   MCV 90 03/15/2020   PLT 423 03/15/2020   Lab Results  Component Value Date   NA 140 06/14/2020   K 4.1 06/14/2020   CO2 19 (L) 06/14/2020   GLUCOSE 115 (H) 06/14/2020   BUN 6 06/14/2020   CREATININE 0.76 06/14/2020   BILITOT 0.3 06/14/2020   ALKPHOS 57 06/14/2020   AST 18 06/14/2020   ALT 14 06/14/2020   PROT 7.3 06/14/2020   ALBUMIN 4.5 06/14/2020   CALCIUM 9.2 06/14/2020   Lab Results  Component Value Date   CHOL 217 (H) 06/14/2020   Lab Results  Component Value Date   HDL 35 (L) 06/14/2020   Lab Results  Component Value Date   LDLCALC 119 (H) 06/14/2020   Lab Results  Component Value Date   TRIG 356 (H) 06/14/2020   Lab Results  Component Value Date   CHOLHDL 6.2 (H) 06/14/2020   Lab Results  Component Value Date   HGBA1C 5.9 (H) 06/14/2020      Assessment & Plan:   Problem List Items Addressed This Visit       Cardiovascular and Mediastinum   Primary hypertension    BP Readings from Last 1 Encounters:  01/09/21 140/86  Uncontrolled, on Losartan 50 mg QD - but she has not taken it today Counseled for compliance with the medications Advised DASH diet and moderate exercise/walking, at least 150 mins/week       Relevant  Medications   losartan (COZAAR) 50 MG tablet     Respiratory   Acute non-recurrent maxillary sinusitis - Primary    Sinus pain and pressure with nasal congestion Started Augmentin Nasal saline spray PRN      Relevant Medications   amoxicillin-clavulanate (AUGMENTIN) 875-125 MG tablet   fluticasone (FLONASE) 50 MCG/ACT nasal spray     Digestive   Recurrent pancreatitis    Related to alcohol use and/or hypertriglyceridemia Recent ER visit for pancreatitis, advised to increase fluid intake and avoid alcohol use Soft diet, advance as tolerated        Other   Encounter for examination following treatment at hospital ER visit for acute pancreatitis, treated with IV fluids Will try to obtain records    Meds ordered this encounter  Medications   amoxicillin-clavulanate (AUGMENTIN) 875-125 MG tablet    Sig: Take 1 tablet by mouth 2 (two) times daily.    Dispense:  14 tablet    Refill:  0   fluticasone (FLONASE) 50 MCG/ACT nasal spray    Sig: Place 2 sprays into both nostrils daily.    Dispense:  16 g    Refill:  2   losartan (COZAAR) 50 MG tablet    Sig: Take 1 tablet (50 mg total) by mouth daily.    Dispense:  90 tablet    Refill:  1    Follow-up: Return in about 6 months (around 07/09/2021) for HTN and HLD.    Makaria Poarch Concha Se,  MD

## 2021-01-10 NOTE — Assessment & Plan Note (Signed)
BP Readings from Last 1 Encounters:  01/09/21 140/86   Uncontrolled, on Losartan 50 mg QD - but she has not taken it today Counseled for compliance with the medications Advised DASH diet and moderate exercise/walking, at least 150 mins/week

## 2021-02-19 ENCOUNTER — Encounter (INDEPENDENT_AMBULATORY_CARE_PROVIDER_SITE_OTHER): Payer: Self-pay

## 2021-02-19 ENCOUNTER — Ambulatory Visit (INDEPENDENT_AMBULATORY_CARE_PROVIDER_SITE_OTHER): Payer: 59 | Admitting: Internal Medicine

## 2021-02-19 ENCOUNTER — Other Ambulatory Visit: Payer: Self-pay

## 2021-02-19 ENCOUNTER — Encounter: Payer: Self-pay | Admitting: Internal Medicine

## 2021-02-19 ENCOUNTER — Telehealth: Payer: Self-pay | Admitting: Internal Medicine

## 2021-02-19 DIAGNOSIS — K852 Alcohol induced acute pancreatitis without necrosis or infection: Secondary | ICD-10-CM

## 2021-02-19 DIAGNOSIS — K859 Acute pancreatitis without necrosis or infection, unspecified: Secondary | ICD-10-CM | POA: Diagnosis not present

## 2021-02-19 DIAGNOSIS — E782 Mixed hyperlipidemia: Secondary | ICD-10-CM

## 2021-02-19 MED ORDER — HYDROCODONE-ACETAMINOPHEN 5-325 MG PO TABS: 1.0000 | ORAL_TABLET | Freq: Three times a day (TID) | ORAL | 0 refills | Status: DC | PRN

## 2021-02-19 NOTE — Assessment & Plan Note (Signed)
Lipid profile reviewed Had started Crestor -compliance is questionable Plan to add Vascepa if uncontrolled TG

## 2021-02-19 NOTE — Telephone Encounter (Signed)
Pt will need an appt with patel can be virtual  

## 2021-02-19 NOTE — Assessment & Plan Note (Signed)
Related to alcohol use and/or hypertriglyceridemia Has had ER visits for pancreatitis, advised to increase fluid intake and avoid alcohol use Soft diet, advance as tolerated Check CMP, lipase and lipid profile Referred to GI

## 2021-02-19 NOTE — Telephone Encounter (Signed)
WORK IN appt Made

## 2021-02-19 NOTE — Telephone Encounter (Signed)
Patient is calling  she is having a flare up of pancretitis, and would like that to be noted, and something for pain, or if there is something else she can take over the counter

## 2021-02-19 NOTE — Progress Notes (Signed)
Virtual Visit via Telephone Note   This visit type was conducted due to national recommendations for restrictions regarding the COVID-19 Pandemic (e.g. social distancing) in an effort to limit this patient's exposure and mitigate transmission in our community.  Due to her co-morbid illnesses, this patient is at least at moderate risk for complications without adequate follow up.  This format is felt to be most appropriate for this patient at this time.  The patient did not have access to video technology/had technical difficulties with video requiring transitioning to audio format only (telephone).  All issues noted in this document were discussed and addressed.  No physical exam could be performed with this format.  Evaluation Performed:  Follow-up visit  Date:  02/19/2021   ID:  Tracy Ortiz, DOB 1980/11/17, MRN [id number]  Patient Location: Home Provider Location: Office/Clinic  Participants: Patient Location of Patient: Home Location of Provider: Telehealth Consent was obtain for visit to be over via telehealth. I verified that I am speaking with the correct person using two identifiers.  PCP:  Lindell Spar, MD   Chief Complaint: Abdominal pain  History of Present Illness:    Tracy Ortiz is a 40 y.o. female who has a televisit for complaint of abdominal/epigastric pain, which is constant for last 3 days.  She has history of recurrent pancreatitis.  She also complains of nausea and vomiting, better with Phenergan.  She also took Norco, which she had from ER visit in the past, but she requested refill as she has run out of it now.  Of note, she had an alcoholic drink over the last weekend.  She also has history of hyperTG, for which she was started on Crestor -compliance is questionable.  She requests a work note, which will be provided.  She has missed work due to episodes of pancreatitis recently as well.  She was referred to GI in the past, but could not follow-up at  that time.  The patient does not have symptoms concerning for COVID-19 infection (fever, chills, cough, or new shortness of breath).   Past Medical, Surgical, Social History, Allergies, and Medications have been Reviewed.  Past Medical History:  Diagnosis Date   HPV (human papilloma virus) infection    Hyperlipidemia    Hypertension    Pancreatitis    History reviewed. No pertinent surgical history.   Current Meds  Medication Sig   famotidine (PEPCID) 20 MG tablet Take 1 tablet (20 mg total) by mouth 2 (two) times daily.   fluticasone (FLONASE) 50 MCG/ACT nasal spray Place 2 sprays into both nostrils daily.   HYDROcodone-acetaminophen (NORCO/VICODIN) 5-325 MG tablet Take 1 tablet by mouth every 8 (eight) hours as needed for moderate pain or severe pain.   losartan (COZAAR) 50 MG tablet Take 1 tablet (50 mg total) by mouth daily.   promethazine (PHENERGAN) 25 MG tablet Take 25 mg by mouth every 6 (six) hours as needed for nausea or vomiting.   rosuvastatin (CRESTOR) 10 MG tablet Take 1 tablet (10 mg total) by mouth daily.     Allergies:   Patient has no allergy information on record.   ROS:   Please see the history of present illness.     All other systems reviewed and are negative.   Labs/Other Tests and Data Reviewed:    Recent Labs: 03/15/2020: Hemoglobin 11.8; Platelets 423; TSH 1.060 06/14/2020: ALT 14; BUN 6; Creatinine, Ser 0.76; Potassium 4.1; Sodium 140   Recent Lipid Panel Lab Results  Component  Value Date/Time   CHOL 217 (H) 06/14/2020 10:02 AM   TRIG 356 (H) 06/14/2020 10:02 AM   HDL 35 (L) 06/14/2020 10:02 AM   CHOLHDL 6.2 (H) 06/14/2020 10:02 AM   LDLCALC 119 (H) 06/14/2020 10:02 AM    Wt Readings from Last 3 Encounters:  01/09/21 200 lb 1.9 oz (90.8 kg)  06/14/20 195 lb 6.4 oz (88.6 kg)  06/14/20 195 lb (88.5 kg)     ASSESSMENT & PLAN:    Acute pancreatitis Advised for proper hydration Soft diet as tolerated Phenergan as needed for nausea or  vomiting Norco as needed for moderate to severe pain  Recurrent pancreatitis Related to alcohol use and/or hypertriglyceridemia Has had ER visits for pancreatitis, advised to increase fluid intake and avoid alcohol use Soft diet, advance as tolerated Check CMP, lipase and lipid profile Referred to GI  Hyperlipidemia Lipid profile reviewed Had started Crestor -compliance is questionable Plan to add Vascepa if uncontrolled TG    Time:   Today, I have spent 16 minutes reviewing the chart, including problem list, medications, and with the patient with telehealth technology discussing the above problems.   Medication Adjustments/Labs and Tests Ordered: Current medicines are reviewed at length with the patient today.  Concerns regarding medicines are outlined above.   Tests Ordered: Orders Placed This Encounter  Procedures   CMP14+EGFR   Lipase   Lipid Profile   Ambulatory referral to Gastroenterology    Medication Changes: Meds ordered this encounter  Medications   HYDROcodone-acetaminophen (NORCO/VICODIN) 5-325 MG tablet    Sig: Take 1 tablet by mouth every 8 (eight) hours as needed for moderate pain or severe pain.    Dispense:  10 tablet    Refill:  0     Note: This dictation was prepared with Dragon dictation along with smaller phrase technology. Similar sounding words can be transcribed inadequately or may not be corrected upon review. Any transcriptional errors that result from this process are unintentional.      Disposition:  Follow up  Signed, Lindell Spar, MD  02/19/2021 2:00 PM     [place]

## 2021-02-21 ENCOUNTER — Telehealth: Payer: Self-pay

## 2021-02-21 LAB — CMP14+EGFR
ALT: 25 IU/L (ref 0–32)
AST: 34 IU/L (ref 0–40)
Albumin/Globulin Ratio: 1.6 (ref 1.2–2.2)
Albumin: 5.1 g/dL — ABNORMAL HIGH (ref 3.8–4.8)
Alkaline Phosphatase: 68 IU/L (ref 44–121)
BUN/Creatinine Ratio: 8 — ABNORMAL LOW (ref 9–23)
BUN: 7 mg/dL (ref 6–20)
Bilirubin Total: 0.5 mg/dL (ref 0.0–1.2)
CO2: 23 mmol/L (ref 20–29)
Calcium: 9.4 mg/dL (ref 8.7–10.2)
Chloride: 96 mmol/L (ref 96–106)
Creatinine, Ser: 0.91 mg/dL (ref 0.57–1.00)
Globulin, Total: 3.2 g/dL (ref 1.5–4.5)
Glucose: 99 mg/dL (ref 70–99)
Potassium: 3.6 mmol/L (ref 3.5–5.2)
Sodium: 136 mmol/L (ref 134–144)
Total Protein: 8.3 g/dL (ref 6.0–8.5)
eGFR: 83 mL/min/{1.73_m2} (ref >59)

## 2021-02-21 LAB — LIPID PANEL
Chol/HDL Ratio: 6.1 ratio — ABNORMAL HIGH (ref 0.0–4.4)
Cholesterol, Total: 225 mg/dL — ABNORMAL HIGH (ref 100–199)
HDL: 37 mg/dL — ABNORMAL LOW (ref >39)
LDL Chol Calc (NIH): 117 mg/dL — ABNORMAL HIGH (ref 0–99)
Triglycerides: 406 mg/dL — ABNORMAL HIGH (ref 0–149)
VLDL Cholesterol Cal: 71 mg/dL — ABNORMAL HIGH (ref 5–40)

## 2021-02-21 LAB — LIPASE: Lipase: 54 U/L (ref 14–72)

## 2021-02-21 NOTE — Telephone Encounter (Signed)
FMLA   Noted Copied  Sleeved  

## 2021-02-25 ENCOUNTER — Encounter: Payer: Self-pay | Admitting: Internal Medicine

## 2021-03-03 DIAGNOSIS — Z0279 Encounter for issue of other medical certificate: Secondary | ICD-10-CM

## 2021-03-05 NOTE — Telephone Encounter (Signed)
Completed  Pt Aware, and advised [place] to fax

## 2021-03-12 ENCOUNTER — Telehealth: Payer: Self-pay | Admitting: Internal Medicine

## 2021-03-12 NOTE — Telephone Encounter (Signed)
Pt called in regards to [facility] paperwork.  Pt jobs states that pages 1 * 7 * 8 need dates

## 2021-03-12 NOTE — Telephone Encounter (Signed)
Has this already been completed? Im not sure we still have copy of this if completed should be documented up front

## 2021-03-12 NOTE — Telephone Encounter (Signed)
Pt advised she will need to bring paperwork back in to be signed

## 2021-04-03 ENCOUNTER — Other Ambulatory Visit: Payer: Self-pay

## 2021-04-03 ENCOUNTER — Ambulatory Visit (INDEPENDENT_AMBULATORY_CARE_PROVIDER_SITE_OTHER): Payer: 59 | Admitting: Internal Medicine

## 2021-04-03 ENCOUNTER — Encounter: Payer: Self-pay | Admitting: Internal Medicine

## 2021-04-03 VITALS — BP 174/118 | HR 91 | Temp 96.6°F | Ht 62.0 in | Wt 196.6 lb

## 2021-04-03 DIAGNOSIS — F1010 Alcohol abuse, uncomplicated: Secondary | ICD-10-CM

## 2021-04-03 DIAGNOSIS — Z720 Tobacco use: Secondary | ICD-10-CM | POA: Diagnosis not present

## 2021-04-03 DIAGNOSIS — K859 Acute pancreatitis without necrosis or infection, unspecified: Secondary | ICD-10-CM | POA: Diagnosis not present

## 2021-04-03 NOTE — Patient Instructions (Signed)
I am going to request her records from [facility].  Your recurrent pancreatitis could certainly be due to combination of chronic alcohol and tobacco use.  I would recommend that you work on decreasing both.  Continue on Crestor for your high cholesterol.  After I have reviewed your hospitalization records, we will contact you.  Otherwise follow-up with GI in 3 to 4 months.  It was very nice meeting you today.  Dr. Marletta Lor  At [facility] we value your feedback. You may receive a survey about your visit today. Please share your experience as we strive to create trusting relationships with our patients to provide genuine, compassionate, quality care.  We appreciate your understanding and patience as we review any laboratory studies, imaging, and other diagnostic tests that are ordered as we care for you. Our office policy is 5 business days for review of these results, and any emergent or urgent results are addressed in a timely manner for your best interest. If you do not hear from our office in 1 week, please contact us.   We also encourage the use of MyChart, which contains your medical information for your review as well. If you are not enrolled in this feature, an access code is on this after visit summary for your convenience. Thank you for allowing [place] to be involved in your care.  It was great to see you today!  I hope you have a great rest of your Fall!    Hennie Duos. Marletta Lor, D.O. Gastroenterology and Hepatology [facility]

## 2021-04-03 NOTE — Progress Notes (Signed)
Primary Care Physician:  Anabel Halon, MD Primary Gastroenterologist:  Dr. Marletta Lor  Chief Complaint  Patient presents with   Pancreatitis    Last episode in October    HPI:   Tracy Ortiz is a 40 y.o. female who presents to the clinic today as a new patient by referral from her PCP Dr. Allena Katz for recurrent pancreatitis.  Patient states she has had numerous hospitalizations for pancreatitis in the past.  These have all been at [facility] though for what ever reason none of her records are available in care everywhere.  States her first episode was around 10 years ago.  No family history of pancreatitis in the past.  Does note chronic alcohol use drinking wine daily.  Used to be a heavier drinker in her 71s.  Gallbladder in situ.  Does have hypertriglyceridemia though highest I have seen in her records is 511.  Recently started on rosuvastatin for this.  Also with chronic tobacco use.  Today she states she is doing well.  Has recovered from her most recent episode.  Tolerating diet without any issues.  She has been told in the past that her pancreatitis is due to chronic alcohol use.  Past Medical History:  Diagnosis Date   HPV (human papilloma virus) infection    Hyperlipidemia    Hypertension    Pancreatitis     No past surgical history on file.  Current Outpatient Medications  Medication Sig Dispense Refill   loratadine (CLARITIN) 10 MG tablet Take 10 mg by mouth daily. As needed     losartan (COZAAR) 50 MG tablet Take 1 tablet (50 mg total) by mouth daily. 90 tablet 1   rosuvastatin (CRESTOR) 10 MG tablet Take 1 tablet (10 mg total) by mouth daily. 90 tablet 0   No current facility-administered medications for this visit.    Allergies as of 04/03/2021   (No Known Allergies)    Family History  Problem Relation Age of Onset   Other Father        healthy   Diabetes Mother    Polycystic ovary syndrome Sister     Social History   Socioeconomic History    Marital status: Married    Spouse name: Not on file   Number of children: Not on file   Years of education: Not on file   Highest education level: Not on file  Occupational History   Not on file  Tobacco Use   Smoking status: Every Day   Smokeless tobacco: Never  Substance and Sexual Activity   Alcohol use: Yes    Comment: wine daily   Drug use: Never   Sexual activity: Yes    Birth control/protection: Condom  Other Topics Concern   Not on file  Social History Narrative   Not on file   Social Determinants of Health   Financial Resource Strain: Low Risk    Difficulty of Paying Living Expenses: Not hard at all  Food Insecurity: No Food Insecurity   Worried About Programme researcher, broadcasting/film/video in the Last Year: Never true   Ran Out of Food in the Last Year: Never true  Transportation Needs: No Transportation Needs   Lack of Transportation (Medical): No   Lack of Transportation (Non-Medical): No  Physical Activity: Sufficiently Active   Days of Exercise per Week: 3 days   Minutes of Exercise per Session: 60 min  Stress: No Stress Concern Present   Feeling of Stress : Not at all  Social  Connections: Socially Integrated   Frequency of Communication with Friends and Family: More than three times a week   Frequency of Social Gatherings with Friends and Family: More than three times a week   Attends Religious Services: More than 4 times per year   Active Member of [employer] or Organizations: No   Attends Engineer, structural: More than 4 times per year   Marital Status: Married  Catering manager Violence: Not At Risk   Fear of Current or Ex-Partner: No   Emotionally Abused: No   Physically Abused: No   Sexually Abused: No    Subjective: Review of Systems  Constitutional:  Negative for chills and fever.  HENT:  Negative for congestion and hearing loss.   Eyes:  Negative for blurred vision and double vision.  Respiratory:  Negative for cough and shortness of breath.    Cardiovascular:  Negative for chest pain and palpitations.  Gastrointestinal:  Negative for abdominal pain, blood in stool, constipation, diarrhea, heartburn, melena and vomiting.  Genitourinary:  Negative for dysuria and urgency.  Musculoskeletal:  Negative for joint pain and myalgias.  Skin:  Negative for itching and rash.  Neurological:  Negative for dizziness and headaches.  Psychiatric/Behavioral:  Negative for depression. The patient is not nervous/anxious.       Objective: BP (!) 174/118   Pulse 91   Temp (!) 96.6 F (35.9 C)   Ht 5\' 2"  (1.575 m)   Wt 196 lb 9.6 oz (89.2 kg)   LMP 03/17/2021   BMI 35.96 kg/m  Physical Exam Constitutional:      Appearance: Normal appearance.  HENT:     Head: Normocephalic and atraumatic.  Eyes:     Extraocular Movements: Extraocular movements intact.     Conjunctiva/sclera: Conjunctivae normal.  Cardiovascular:     Rate and Rhythm: Normal rate and regular rhythm.  Pulmonary:     Effort: Pulmonary effort is normal.     Breath sounds: Normal breath sounds.  Abdominal:     General: Bowel sounds are normal.     Palpations: Abdomen is soft.  Musculoskeletal:        General: No swelling. Normal range of motion.     Cervical back: Normal range of motion and neck supple.  Skin:    General: Skin is warm and dry.     Coloration: Skin is not jaundiced.  Neurological:     General: No focal deficit present.     Mental Status: She is alert and oriented to person, place, and time.  Psychiatric:        Mood and Affect: Mood normal.        Behavior: Behavior normal.     Assessment: *Recurrent pancreatitis *Hypertriglyceridemia-currently on Crestor  Plan: Etiology of patient's recurrent pancreatitis unclear.  None of her hospital records are in care everywhere.  I will request all her most recent imaging and hospitalization paperwork today.  Continue on Crestor for hypertriglyceridemia.  Based on her history, likely pancreatitis is  combination of chronic alcohol and tobacco use.  We may need to pursue further imaging once I have reviewed her records.  She may benefit from EUS as well.  In the meantime I have recommended that she cease alcohol and tobacco use going forward.  Follow-up in 3 to 4 months.  04/03/2021 10:50 AM   Disclaimer: This note was dictated with voice recognition software. Similar sounding words can inadvertently be transcribed and may not be corrected upon review.

## 2021-06-28 ENCOUNTER — Other Ambulatory Visit: Payer: Self-pay | Admitting: Internal Medicine

## 2021-06-28 DIAGNOSIS — E785 Hyperlipidemia, unspecified: Secondary | ICD-10-CM

## 2021-07-09 ENCOUNTER — Ambulatory Visit: Payer: 59 | Admitting: Internal Medicine

## 2021-07-21 ENCOUNTER — Encounter: Payer: Self-pay | Admitting: Internal Medicine

## 2021-07-21 ENCOUNTER — Ambulatory Visit (INDEPENDENT_AMBULATORY_CARE_PROVIDER_SITE_OTHER): Payer: 59 | Admitting: Internal Medicine

## 2021-07-21 ENCOUNTER — Other Ambulatory Visit: Payer: Self-pay

## 2021-07-21 VITALS — BP 136/84 | HR 94 | Ht 62.0 in | Wt 197.6 lb

## 2021-07-21 DIAGNOSIS — IMO0002 Reserved for concepts with insufficient information to code with codable children: Secondary | ICD-10-CM

## 2021-07-21 DIAGNOSIS — K219 Gastro-esophageal reflux disease without esophagitis: Secondary | ICD-10-CM

## 2021-07-21 DIAGNOSIS — K859 Acute pancreatitis without necrosis or infection, unspecified: Secondary | ICD-10-CM | POA: Diagnosis not present

## 2021-07-21 DIAGNOSIS — I10 Essential (primary) hypertension: Secondary | ICD-10-CM | POA: Diagnosis not present

## 2021-07-21 DIAGNOSIS — E782 Mixed hyperlipidemia: Secondary | ICD-10-CM

## 2021-07-21 MED ORDER — PANTOPRAZOLE SODIUM 40 MG PO TBEC: 40.0000 mg | DELAYED_RELEASE_TABLET | Freq: Every day | ORAL | 5 refills | Status: DC

## 2021-07-21 MED ORDER — LOSARTAN POTASSIUM 50 MG PO TABS: 50.0000 mg | ORAL_TABLET | Freq: Every day | ORAL | 1 refills | Status: DC

## 2021-07-21 NOTE — Assessment & Plan Note (Signed)
Check lipid profile ?Had started Crestor -compliance is questionable ?Advised to take omega-3 ?Plan to add Vascepa if uncontrolled TG ?

## 2021-07-21 NOTE — Progress Notes (Signed)
? ?Established Patient Office Visit ? ?Subjective:  ?Patient ID: Tracy Ortiz, female    DOB: 1980/06/28  Age: 41 y.o. MRN: [id number] ? ?CC:  ?Chief Complaint  ?Patient presents with  ? Follow-up  ?  GERD, pancreatitis and HLD  ? ? ?HPI ?Tracy Ortiz is a 41 y.o. female with past medical history of hypertension, sarcoidosis, hyperlipidemia, alcohol and tobacco abuse who presents for f/u of her chronic medical conditions. ? ?HTN: BP is well-controlled. Takes medications regularly. Patient denies headache, dizziness, chest pain, dyspnea or palpitations. ? ?She complains of episodes of epigastric pain and nausea.  She has history of recurrent pancreatitis, likely due to alcohol use.  She has to take some days off from her work due to episodes of pancreatitis.  She has seen GI for it, but has not had follow-up after it.  Chart review suggests plan for possible EUS for it.  She currently takes Pepcid twice daily for GERD.  She denies any melena or hematochezia currently. ? ? ? ?Past Medical History:  ?Diagnosis Date  ? HPV (human papilloma virus) infection   ? Hyperlipidemia   ? Hypertension   ? Pancreatitis   ? ? ?History reviewed. No pertinent surgical history. ? ?Family History  ?Problem Relation Age of Onset  ? Other Father   ?     healthy  ? Diabetes Mother   ? Polycystic ovary syndrome Sister   ? ? ?Social History  ? ?Socioeconomic History  ? Marital status: Married  ?  Spouse name: Not on file  ? Number of children: Not on file  ? Years of education: Not on file  ? Highest education level: Not on file  ?Occupational History  ? Not on file  ?Tobacco Use  ? Smoking status: Every Day  ? Smokeless tobacco: Never  ?Substance and Sexual Activity  ? Alcohol use: Yes  ?  Comment: wine daily  ? Drug use: Never  ? Sexual activity: Yes  ?  Birth control/protection: Condom  ?Other Topics Concern  ? Not on file  ?Social History Narrative  ? Not on file  ? ?Social Determinants of Health  ? ?Financial Resource Strain: Not  on file  ?Food Insecurity: Not on file  ?Transportation Needs: Not on file  ?Physical Activity: Not on file  ?Stress: Not on file  ?Social Connections: Not on file  ?Intimate Partner Violence: Not on file  ? ? ?Outpatient Medications Prior to Visit  ?Medication Sig Dispense Refill  ? loratadine (CLARITIN) 10 MG tablet Take 10 mg by mouth daily. As needed    ? rosuvastatin (CRESTOR) 10 MG tablet Take 1 tablet by mouth once daily 30 tablet 0  ? losartan (COZAAR) 50 MG tablet Take 1 tablet (50 mg total) by mouth daily. 90 tablet 1  ? ?No facility-administered medications prior to visit.  ? ? ?No Known Allergies ? ?ROS ?Review of Systems  ?Constitutional:  Negative for chills and fever.  ?HENT:  Negative for congestion, sinus pressure, sinus pain and sore throat.   ?Eyes:  Negative for pain and discharge.  ?Respiratory:  Negative for cough and shortness of breath.   ?Cardiovascular:  Negative for chest pain and palpitations.  ?Gastrointestinal:  Negative for abdominal pain, constipation, diarrhea, nausea and vomiting.  ?Endocrine: Negative for polydipsia and polyuria.  ?Genitourinary:  Negative for dysuria, hematuria, vaginal discharge and vaginal pain.  ?Musculoskeletal:  Negative for neck pain and neck stiffness.  ?Skin:  Negative for rash.  ?Neurological:  Negative for dizziness and  weakness.  ?Psychiatric/Behavioral:  Negative for agitation and behavioral problems.   ? ?  ?Objective:  ?  ?Physical Exam ?Vitals reviewed.  ?Constitutional:   ?   General: She is not in acute distress. ?   Appearance: She is obese. She is not diaphoretic.  ?HENT:  ?   Head: Normocephalic and atraumatic.  ?   Nose: Nose normal.  ?   Mouth/Throat:  ?   Mouth: Mucous membranes are moist.  ?Eyes:  ?   General: No scleral icterus. ?   Extraocular Movements: Extraocular movements intact.  ?Cardiovascular:  ?   Rate and Rhythm: Normal rate and regular rhythm.  ?   Pulses: Normal pulses.  ?   Heart sounds: Normal heart sounds. No murmur  heard. ?Pulmonary:  ?   Breath sounds: Normal breath sounds. No wheezing or rales.  ?Abdominal:  ?   General: Bowel sounds are normal.  ?   Palpations: Abdomen is soft.  ?   Tenderness: There is no abdominal tenderness. There is no guarding or rebound.  ?Musculoskeletal:  ?   Cervical back: Neck supple. No tenderness.  ?   Right lower leg: No edema.  ?   Left lower leg: No edema.  ?Skin: ?   General: Skin is warm.  ?   Findings: No rash.  ?Neurological:  ?   General: No focal deficit present.  ?   Mental Status: She is alert and oriented to person, place, and time.  ?   Sensory: No sensory deficit.  ?   Motor: No weakness.  ?Psychiatric:     ?   Mood and Affect: Mood normal.     ?   Behavior: Behavior normal.  ? ? ?BP 136/84 (BP Location: Right Arm, Patient Position: Sitting)   Pulse 94   Ht 5' 2"  (1.575 m)   Wt 197 lb 9.6 oz (89.6 kg)   SpO2 98%   BMI 36.14 kg/m?  ?Wt Readings from Last 3 Encounters:  ?07/21/21 197 lb 9.6 oz (89.6 kg)  ?04/03/21 196 lb 9.6 oz (89.2 kg)  ?01/09/21 200 lb 1.9 oz (90.8 kg)  ? ? ?Lab Results  ?Component Value Date  ? TSH 1.060 03/15/2020  ? ?Lab Results  ?Component Value Date  ? WBC 4.9 03/15/2020  ? HGB 11.8 03/15/2020  ? HCT 34.9 03/15/2020  ? MCV 90 03/15/2020  ? PLT 423 03/15/2020  ? ?Lab Results  ?Component Value Date  ? NA 136 02/20/2021  ? K 3.6 02/20/2021  ? CO2 23 02/20/2021  ? GLUCOSE 99 02/20/2021  ? BUN 7 02/20/2021  ? CREATININE 0.91 02/20/2021  ? BILITOT 0.5 02/20/2021  ? ALKPHOS 68 02/20/2021  ? AST 34 02/20/2021  ? ALT 25 02/20/2021  ? PROT 8.3 02/20/2021  ? ALBUMIN 5.1 (H) 02/20/2021  ? CALCIUM 9.4 02/20/2021  ? EGFR 83 02/20/2021  ? ?Lab Results  ?Component Value Date  ? CHOL 225 (H) 02/20/2021  ? ?Lab Results  ?Component Value Date  ? HDL 37 (L) 02/20/2021  ? ?Lab Results  ?Component Value Date  ? LDLCALC 117 (H) 02/20/2021  ? ?Lab Results  ?Component Value Date  ? TRIG 406 (H) 02/20/2021  ? ?Lab Results  ?Component Value Date  ? CHOLHDL 6.1 (H) 02/20/2021   ? ?Lab Results  ?Component Value Date  ? HGBA1C 5.9 (H) 06/14/2020  ? ? ?  ?Assessment & Plan:  ? ?Problem List Items Addressed This Visit   ? ?  ? Cardiovascular and Mediastinum  ?  Primary hypertension  ?  BP Readings from Last 1 Encounters:  ?07/21/21 136/84  ?Well controlled with losartan 50 mg QD ?Counseled for compliance with the medications ?Advised DASH diet and moderate exercise/walking, at least 150 mins/week ? ?  ?  ? Relevant Medications  ? losartan (COZAAR) 50 MG tablet  ?  ? Digestive  ? Recurrent pancreatitis - Primary  ?  Related to alcohol use and/or hypertriglyceridemia ?Has had ER visits for pancreatitis, advised to increase fluid intake and avoid alcohol use ?Needs to follow-up with GI for possible need for EUS ?Will check [place] RUQ for now ?Soft diet, advance as tolerated ?Check CMP and lipid profile ?  ?  ? Relevant Medications  ? pantoprazole (PROTONIX) 40 MG tablet  ? Other Relevant Orders  ? US Abdomen Limited RUQ (LIVER/GB)  ? Gastroesophageal reflux disease  ?  Uncontrolled with Pepcid ?Switched to Protonix ?Can take Pepcid in the evening as needed ?  ?  ? Relevant Medications  ? pantoprazole (PROTONIX) 40 MG tablet  ?  ? Other  ? Hyperlipidemia  ?  Check lipid profile ?Had started Crestor -compliance is questionable ?Advised to take omega-3 ?Plan to add Vascepa if uncontrolled TG ?  ?  ? Relevant Medications  ? losartan (COZAAR) 50 MG tablet  ? Other Relevant Orders  ? Lipid Profile  ? CMP14+EGFR  ? ? ?Meds ordered this encounter  ?Medications  ? pantoprazole (PROTONIX) 40 MG tablet  ?  Sig: Take 1 tablet (40 mg total) by mouth daily.  ?  Dispense:  30 tablet  ?  Refill:  5  ? losartan (COZAAR) 50 MG tablet  ?  Sig: Take 1 tablet (50 mg total) by mouth daily.  ?  Dispense:  90 tablet  ?  Refill:  1  ? ? ?Follow-up: Return in about 6 months (around 01/21/2022) for Annual physical.  ? ? ?Lindell Spar, MD ?

## 2021-07-21 NOTE — Assessment & Plan Note (Addendum)
Related to alcohol use and/or hypertriglyceridemia ?Has had ER visits for pancreatitis, advised to increase fluid intake and avoid alcohol use ?Needs to follow-up with GI for possible need for EUS ?Will check [place] RUQ for now ?Soft diet, advance as tolerated ?Check CMP and lipid profile ?

## 2021-07-21 NOTE — Patient Instructions (Addendum)
Please start taking Pantoprazole for acid reflux. Okay to take Famotidine as needed in the evening for acid reflux. ? ?Please avoid alcohol use. ? ?Please start taking Omega-3 2000 mg twice daily. ?

## 2021-07-21 NOTE — Assessment & Plan Note (Signed)
BP Readings from Last 1 Encounters:  ?07/21/21 136/84  ? ?Well controlled with losartan 50 mg QD ?Counseled for compliance with the medications ?Advised DASH diet and moderate exercise/walking, at least 150 mins/week ? ?

## 2021-07-21 NOTE — Assessment & Plan Note (Addendum)
Uncontrolled with Pepcid ?Switched to Protonix ?Can take Pepcid in the evening as needed ?

## 2021-07-22 LAB — CMP14+EGFR
ALT: 45 IU/L — ABNORMAL HIGH (ref 0–32)
AST: 50 IU/L — ABNORMAL HIGH (ref 0–40)
Albumin/Globulin Ratio: 1.9 (ref 1.2–2.2)
Albumin: 4.4 g/dL (ref 3.8–4.8)
Alkaline Phosphatase: 64 IU/L (ref 44–121)
BUN/Creatinine Ratio: 14 (ref 9–23)
BUN: 8 mg/dL (ref 6–20)
Bilirubin Total: 0.3 mg/dL (ref 0.0–1.2)
CO2: 22 mmol/L (ref 20–29)
Calcium: 8.7 mg/dL (ref 8.7–10.2)
Chloride: 102 mmol/L (ref 96–106)
Creatinine, Ser: 0.59 mg/dL (ref 0.57–1.00)
Globulin, Total: 2.3 g/dL (ref 1.5–4.5)
Glucose: 123 mg/dL — ABNORMAL HIGH (ref 70–99)
Potassium: 4.3 mmol/L (ref 3.5–5.2)
Sodium: 138 mmol/L (ref 134–144)
Total Protein: 6.7 g/dL (ref 6.0–8.5)
eGFR: 118 mL/min/{1.73_m2} (ref >59)

## 2021-07-22 LAB — LIPID PANEL
Chol/HDL Ratio: 3.8 ratio (ref 0.0–4.4)
Cholesterol, Total: 154 mg/dL (ref 100–199)
HDL: 41 mg/dL (ref >39)
LDL Chol Calc (NIH): 30 mg/dL (ref 0–99)
Triglycerides: 613 mg/dL (ref 0–149)
VLDL Cholesterol Cal: 83 mg/dL — ABNORMAL HIGH (ref 5–40)

## 2021-07-22 MED ORDER — ICOSAPENT ETHYL 1 G PO CAPS: 2.0000 g | ORAL_CAPSULE | Freq: Two times a day (BID) | ORAL | 5 refills | Status: DC

## 2021-07-22 NOTE — Addendum Note (Signed)
Addended byIhor Dow on: 07/22/2021 07:57 AM ? ? Modules accepted: Orders ? ?

## 2021-07-23 ENCOUNTER — Telehealth: Payer: Self-pay

## 2021-07-24 ENCOUNTER — Other Ambulatory Visit: Payer: Self-pay

## 2021-07-24 DIAGNOSIS — E782 Mixed hyperlipidemia: Secondary | ICD-10-CM

## 2021-07-24 MED ORDER — ICOSAPENT ETHYL 1 G PO CAPS: 2.0000 g | ORAL_CAPSULE | Freq: Two times a day (BID) | ORAL | 5 refills | Status: DC

## 2021-07-29 ENCOUNTER — Ambulatory Visit (HOSPITAL_COMMUNITY)
Admission: RE | Admit: 2021-07-29 | Discharge: 2021-07-29 | Disposition: A | Discharge status: Home / Self Care | Payer: 59 | Source: Ambulatory Visit | Attending: Internal Medicine | Admitting: Internal Medicine

## 2021-07-29 ENCOUNTER — Telehealth: Payer: Self-pay | Admitting: Internal Medicine

## 2021-07-29 DIAGNOSIS — K861 Other chronic pancreatitis: Secondary | ICD-10-CM | POA: Diagnosis present

## 2021-07-29 DIAGNOSIS — R932 Abnormal findings on diagnostic imaging of liver and biliary tract: Secondary | ICD-10-CM | POA: Diagnosis not present

## 2021-07-29 DIAGNOSIS — IMO0002 Reserved for concepts with insufficient information to code with codable children: Secondary | ICD-10-CM

## 2021-07-29 NOTE — Telephone Encounter (Signed)
Pt was not talking about rosuvastatin pt need prior auth on icosapent ethyl advised that we would prior auth this and call when we heard back  ?

## 2021-07-29 NOTE — Telephone Encounter (Signed)
Patient called in regard to  ? ?rosuvastatin (CRESTOR) 10 MG tablet ? ?Patient states that insurance will not cover med and out of pocket cost is about $220. ?Med is too expensive for patient to cover out of pocket. ?Patient would like new script sent in. ? ? ? ? ?

## 2021-07-29 NOTE — Telephone Encounter (Signed)
Tried to call pt- no answer. She can use a good rx card and at [facility] the med will be $14.  ?

## 2021-07-30 ENCOUNTER — Telehealth: Payer: Self-pay

## 2021-07-30 ENCOUNTER — Telehealth: Payer: Self-pay | Admitting: Internal Medicine

## 2021-07-30 NOTE — Telephone Encounter (Signed)
Pt notified with verbal understanding  °

## 2021-07-30 NOTE — Telephone Encounter (Signed)
FMLA  extend [employer] [facility] 03.15.2023 ----- ?  ?Copied ?Noted ?sleeved ?

## 2021-07-30 NOTE — Telephone Encounter (Signed)
Return call ultrasound results ?

## 2021-08-04 DIAGNOSIS — Z0279 Encounter for issue of other medical certificate: Secondary | ICD-10-CM

## 2021-08-05 NOTE — Telephone Encounter (Signed)
Left voicemail for patient will fax forms and copy to be picked up ?

## 2021-09-16 ENCOUNTER — Other Ambulatory Visit: Payer: Self-pay | Admitting: Internal Medicine

## 2021-09-16 DIAGNOSIS — E785 Hyperlipidemia, unspecified: Secondary | ICD-10-CM

## 2021-10-09 ENCOUNTER — Other Ambulatory Visit: Payer: Self-pay | Admitting: Internal Medicine

## 2021-10-09 DIAGNOSIS — E785 Hyperlipidemia, unspecified: Secondary | ICD-10-CM

## 2021-11-13 ENCOUNTER — Other Ambulatory Visit: Payer: Self-pay | Admitting: Internal Medicine

## 2021-11-13 DIAGNOSIS — E785 Hyperlipidemia, unspecified: Secondary | ICD-10-CM

## 2022-01-20 ENCOUNTER — Encounter: Payer: Self-pay | Admitting: Internal Medicine

## 2022-01-20 ENCOUNTER — Ambulatory Visit (INDEPENDENT_AMBULATORY_CARE_PROVIDER_SITE_OTHER): Payer: 59 | Admitting: Internal Medicine

## 2022-01-20 VITALS — BP 164/110 | HR 95 | Resp 18 | Ht 62.0 in | Wt 200.2 lb

## 2022-01-20 DIAGNOSIS — Z2821 Immunization not carried out because of patient refusal: Secondary | ICD-10-CM

## 2022-01-20 DIAGNOSIS — E559 Vitamin D deficiency, unspecified: Secondary | ICD-10-CM

## 2022-01-20 DIAGNOSIS — F411 Generalized anxiety disorder: Secondary | ICD-10-CM

## 2022-01-20 DIAGNOSIS — I10 Essential (primary) hypertension: Secondary | ICD-10-CM

## 2022-01-20 DIAGNOSIS — K860 Alcohol-induced chronic pancreatitis: Secondary | ICD-10-CM | POA: Diagnosis not present

## 2022-01-20 DIAGNOSIS — Z720 Tobacco use: Secondary | ICD-10-CM | POA: Diagnosis not present

## 2022-01-20 DIAGNOSIS — F10288 Alcohol dependence with other alcohol-induced disorder: Secondary | ICD-10-CM

## 2022-01-20 DIAGNOSIS — E785 Hyperlipidemia, unspecified: Secondary | ICD-10-CM

## 2022-01-20 DIAGNOSIS — Z0001 Encounter for general adult medical examination with abnormal findings: Secondary | ICD-10-CM | POA: Diagnosis not present

## 2022-01-20 MED ORDER — TELMISARTAN-HCTZ 40-12.5 MG PO TABS: 1.0000 | ORAL_TABLET | Freq: Every day | ORAL | 3 refills | Status: DC

## 2022-01-20 MED ORDER — ROSUVASTATIN CALCIUM 10 MG PO TABS: 10.0000 mg | ORAL_TABLET | Freq: Every day | ORAL | 1 refills | Status: DC

## 2022-01-20 MED ORDER — BUPROPION HCL ER (XL) 150 MG PO TB24: 150.0000 mg | ORAL_TABLET | Freq: Every day | ORAL | 2 refills | Status: DC

## 2022-01-20 NOTE — Assessment & Plan Note (Signed)
BP Readings from Last 1 Encounters:  01/20/22 (!) 164/110   Uncontrolled with losartan 50 mg QD Likely due to alcohol dependence, needs to cut down Started telmisartan- HCTZ 40-12.5 mg QD instead of losartan Counseled for compliance with the medications Advised DASH diet and moderate exercise/walking, at least 150 mins/week

## 2022-01-20 NOTE — Assessment & Plan Note (Signed)
Check lipid profile Had started Crestor -compliance is questionable Advised to take omega-3 Was not able to get Vascepa due to insurance coverage concern 

## 2022-01-20 NOTE — Assessment & Plan Note (Signed)
Stressed at work Has been drinking alcohol to relieve stress Started Wellbutrin for GAD, also would help with looking cessation

## 2022-01-20 NOTE — Patient Instructions (Addendum)
Please stop taking Losartan and start taking Telmisartan-HCTZ as prescribed.  Please start taking Wellbutrin as prescribed.  Please try to cut down alcohol. Please try to join one of the support groups.  Please continue to follow low salt diet and ambulate as tolerated.  Please start taking Thiamine 50 mcg, folic acid 1 mg and L79 500 mcg once a day.  Please get fasting blood tests done before the next visit.

## 2022-01-20 NOTE — Assessment & Plan Note (Signed)
Related to alcohol use and/or hypertriglyceridemia Has had ER visits for pancreatitis, advised to increase fluid intake and avoid alcohol use Needs to follow-up with GI for possible need for EUS Soft diet, advance as tolerated Check CMP and lipid profile

## 2022-01-20 NOTE — Progress Notes (Signed)
Established Patient Office Visit  Subjective:  Patient ID: Tracy Ortiz, female    DOB: [birth year]  Age: 41 y.o. MRN: [id number]  CC:  Chief Complaint  Patient presents with   Annual Exam    Annual exam pt is still drinking has had pancreatitis and gastritis     HPI Tracy Ortiz is a 41 y.o. female with past medical history of hypertension, sarcoidosis, hyperlipidemia, alcohol and tobacco abuse who presents for annual physical.  Recurrent pancreatitis: She has had multiple episodes of pancreatitis, likely due to alcohol dependence.  She currently takes 7-8 drinks of vodka a day and states that she takes it for stress/anxiety.  She acknowledges that she needs to cut down.  She currently denies any abdominal pain, nausea or vomiting.  Her last drink was 2 days ago.  After discussion, she agrees to explore local support groups and is willing to join them.  She has had GI evaluation in the past, but did not follow-up after initial consultation.  HTN: Her BP is elevated today.  She states that she has taken losartan today, but forgets to take it at times.  She denies any headache, dizziness, chest pain, dyspnea or palpitations currently.  HLD: She takes Crestor, but could not take Vascepa as her insurance did not cover it.    Past Medical History:  Diagnosis Date   HPV (human papilloma virus) infection    Hyperlipidemia    Hypertension    Pancreatitis     History reviewed. No pertinent surgical history.  Family History  Problem Relation Age of Onset   Other Father        healthy   Diabetes Mother    Polycystic ovary syndrome Sister     Social History   Socioeconomic History   Marital status: Married    Spouse name: Not on file   Number of children: Not on file   Years of education: Not on file   Highest education level: Not on file  Occupational History   Not on file  Tobacco Use   Smoking status: Every Day   Smokeless tobacco: Never  Substance and Sexual  Activity   Alcohol use: Yes    Comment: wine daily   Drug use: Never   Sexual activity: Yes    Birth control/protection: Condom  Other Topics Concern   Not on file  Social History Narrative   Not on file   Social Determinants of Health   Financial Resource Strain: Low Risk  (06/14/2020)   Overall Financial Resource Strain (CARDIA)    Difficulty of Paying Living Expenses: Not hard at all  Food Insecurity: No Food Insecurity (06/14/2020)   Hunger Vital Sign    Worried About Running Out of Food in the Last Year: Never true    [employer] in the Last Year: Never true  Transportation Needs: No Transportation Needs (06/14/2020)   PRAPARE - Hydrologist (Medical): No    Lack of Transportation (Non-Medical): No  Physical Activity: Sufficiently Active (06/14/2020)   Exercise Vital Sign    Days of Exercise per Week: 3 days    Minutes of Exercise per Session: 60 min  Stress: No Stress Concern Present (06/14/2020)   [place]    Feeling of Stress : Not at all  Social Connections: [place] (06/14/2020)   Social Connection and Isolation Panel [NHANES]    Frequency of Communication with Friends and Family: More  than three times a week    Frequency of Social Gatherings with Friends and Family: More than three times a week    Attends Religious Services: More than 4 times per year    Active Member of Clubs or Organizations: No    Attends Music therapist: More than 4 times per year    Marital Status: Married  Human resources officer Violence: Not At Risk (06/14/2020)   Humiliation, Afraid, Rape, and Kick questionnaire    Fear of Current or Ex-Partner: No    Emotionally Abused: No    Physically Abused: No    Sexually Abused: No    Outpatient Medications Prior to Visit  Medication Sig Dispense Refill   icosapent Ethyl (VASCEPA) 1 g capsule Take 2 capsules (2 g total) by mouth 2  (two) times daily. 120 capsule 5   loratadine (CLARITIN) 10 MG tablet Take 10 mg by mouth daily. As needed     pantoprazole (PROTONIX) 40 MG tablet Take 1 tablet (40 mg total) by mouth daily. 30 tablet 5   losartan (COZAAR) 50 MG tablet Take 1 tablet (50 mg total) by mouth daily. 90 tablet 1   rosuvastatin (CRESTOR) 10 MG tablet Take 1 tablet by mouth once daily 30 tablet 0   No facility-administered medications prior to visit.    No Known Allergies  ROS Review of Systems  Constitutional:  Negative for chills and fever.  HENT:  Negative for congestion, sinus pressure, sinus pain and sore throat.   Eyes:  Negative for pain and discharge.  Respiratory:  Negative for cough and shortness of breath.   Cardiovascular:  Negative for chest pain and palpitations.  Gastrointestinal:  Negative for abdominal pain, constipation, diarrhea, nausea and vomiting.  Endocrine: Negative for polydipsia and polyuria.  Genitourinary:  Negative for dysuria, hematuria, vaginal discharge and vaginal pain.  Musculoskeletal:  Negative for neck pain and neck stiffness.  Skin:  Negative for rash.  Neurological:  Negative for dizziness and weakness.  Psychiatric/Behavioral:  Negative for agitation and behavioral problems.       Objective:    Physical Exam Vitals reviewed.  Constitutional:      General: She is not in acute distress.    Appearance: She is obese. She is not diaphoretic.  HENT:     Head: Normocephalic and atraumatic.     Nose: Nose normal.     Mouth/Throat:     Mouth: Mucous membranes are moist.  Eyes:     General: No scleral icterus.    Extraocular Movements: Extraocular movements intact.  Cardiovascular:     Rate and Rhythm: Normal rate and regular rhythm.     Pulses: Normal pulses.     Heart sounds: Normal heart sounds. No murmur heard. Pulmonary:     Breath sounds: Normal breath sounds. No wheezing or rales.  Abdominal:     General: Bowel sounds are normal.     Palpations:  Abdomen is soft.     Tenderness: There is no abdominal tenderness.  Musculoskeletal:     Cervical back: Neck supple. No tenderness.     Right lower leg: No edema.     Left lower leg: No edema.  Skin:    General: Skin is warm.     Findings: No rash.  Neurological:     General: No focal deficit present.     Mental Status: She is alert and oriented to person, place, and time.     Sensory: No sensory deficit.     Motor:  No weakness.  Psychiatric:        Mood and Affect: Mood normal.        Behavior: Behavior normal.     BP (!) 164/110 (BP Location: Right Arm, Patient Position: Sitting)   Pulse 95   Resp 18   Ht 5' 2"  (1.575 m)   Wt 200 lb 3.2 oz (90.8 kg)   SpO2 99%   BMI 36.62 kg/m  Wt Readings from Last 3 Encounters:  01/20/22 200 lb 3.2 oz (90.8 kg)  07/21/21 197 lb 9.6 oz (89.6 kg)  04/03/21 196 lb 9.6 oz (89.2 kg)    Lab Results  Component Value Date   TSH 1.060 03/15/2020   Lab Results  Component Value Date   WBC 4.9 03/15/2020   HGB 11.8 03/15/2020   HCT 34.9 03/15/2020   MCV 90 03/15/2020   PLT 423 03/15/2020   Lab Results  Component Value Date   NA 138 07/21/2021   K 4.3 07/21/2021   CO2 22 07/21/2021   GLUCOSE 123 (H) 07/21/2021   BUN 8 07/21/2021   CREATININE 0.59 07/21/2021   BILITOT 0.3 07/21/2021   ALKPHOS 64 07/21/2021   AST 50 (H) 07/21/2021   ALT 45 (H) 07/21/2021   PROT 6.7 07/21/2021   ALBUMIN 4.4 07/21/2021   CALCIUM 8.7 07/21/2021   EGFR 118 07/21/2021   Lab Results  Component Value Date   CHOL 154 07/21/2021   Lab Results  Component Value Date   HDL 41 07/21/2021   Lab Results  Component Value Date   LDLCALC 30 07/21/2021   Lab Results  Component Value Date   TRIG 613 (HH) 07/21/2021   Lab Results  Component Value Date   CHOLHDL 3.8 07/21/2021   Lab Results  Component Value Date   HGBA1C 5.9 (H) 06/14/2020      Assessment & Plan:   Problem List Items Addressed This Visit       Cardiovascular and  Mediastinum   Primary hypertension    BP Readings from Last 1 Encounters:  01/20/22 (!) 164/110  Uncontrolled with losartan 50 mg QD Likely due to alcohol dependence, needs to cut down Started telmisartan- HCTZ 40-12.5 mg QD instead of losartan Counseled for compliance with the medications Advised DASH diet and moderate exercise/walking, at least 150 mins/week       Relevant Medications   telmisartan-hydrochlorothiazide (MICARDIS HCT) 40-12.5 MG tablet   buPROPion (WELLBUTRIN XL) 150 MG 24 hr tablet   rosuvastatin (CRESTOR) 10 MG tablet   Other Relevant Orders   TSH   CMP14+EGFR   CBC with Differential/Platelet     Digestive   Chronic pancreatitis (HCC)    Related to alcohol use and/or hypertriglyceridemia Has had ER visits for pancreatitis, advised to increase fluid intake and avoid alcohol use Needs to follow-up with GI for possible need for EUS Soft diet, advance as tolerated Check CMP and lipid profile      Relevant Orders   Hemoglobin A1c   CMP14+EGFR     Other   Hyperlipidemia    Check lipid profile Had started Crestor -compliance is questionable Advised to take omega-3 Was not able to get Vascepa due to insurance coverage concern      Relevant Medications   telmisartan-hydrochlorothiazide (MICARDIS HCT) 40-12.5 MG tablet   rosuvastatin (CRESTOR) 10 MG tablet   Other Relevant Orders   Lipid panel   Tobacco abuse    Asked about quitting: confirms that she currently smokes cigarettes Advise to quit smoking: Educated  about QUITTING to reduce the risk of cancer, cardio and cerebrovascular disease. Assess willingness: Unwilling to quit at this time, but is working on cutting back. Assist with counseling and pharmacotherapy: Counseled for 5 minutes and literature provided. Arrange for follow up: If not quitting follow up in 3 months and continue to offer help.      Alcohol dependence ([place])    Takes 7-8 drinks of vodka every day H/o recurrent pancreatitis, f/u  CMP Advised to cut down - support group information provided      Relevant Orders   CMP14+EGFR   Refused influenza vaccine   Encounter for general adult medical examination with abnormal findings - Primary    Physical exam as documented. Counseling done  re healthy lifestyle involving commitment to 150 minutes exercise per week, heart healthy diet, and attaining healthy weight.The importance of adequate sleep also discussed. Changes in health habits are decided on by the patient with goals and time frames  set for achieving them. Immunization and cancer screening needs are specifically addressed at this visit.      Relevant Orders   Lipid panel   CMP14+EGFR   CBC with Differential/Platelet   GAD (generalized anxiety disorder)    Stressed at work Has been drinking alcohol to relieve stress Started Wellbutrin for GAD, also would help with looking cessation      Relevant Medications   buPROPion (WELLBUTRIN XL) 150 MG 24 hr tablet   Other Relevant Orders   TSH   Other Visit Diagnoses     Vitamin D deficiency       Relevant Orders   VITAMIN D 25 Hydroxy (Vit-D Deficiency, Fractures)       Meds ordered this encounter  Medications   telmisartan-hydrochlorothiazide (MICARDIS HCT) 40-12.5 MG tablet    Sig: Take 1 tablet by mouth daily.    Dispense:  30 tablet    Refill:  3   buPROPion (WELLBUTRIN XL) 150 MG 24 hr tablet    Sig: Take 1 tablet (150 mg total) by mouth daily.    Dispense:  30 tablet    Refill:  2   rosuvastatin (CRESTOR) 10 MG tablet    Sig: Take 1 tablet (10 mg total) by mouth daily.    Dispense:  90 tablet    Refill:  1    Follow-up: Return in about 4 weeks (around 02/17/2022) for HTN.    Lindell Spar, MD

## 2022-01-20 NOTE — Assessment & Plan Note (Signed)
Takes 7-8 drinks of vodka every day H/o recurrent pancreatitis, f/u CMP Advised to cut down - support group information provided

## 2022-01-20 NOTE — Assessment & Plan Note (Signed)

## 2022-01-20 NOTE — Assessment & Plan Note (Signed)
Asked about quitting: confirms that she currently smokes cigarettes Advise to quit smoking: Educated about QUITTING to reduce the risk of cancer, cardio and cerebrovascular disease. Assess willingness: Unwilling to quit at this time, but is working on cutting back. Assist with counseling and pharmacotherapy: Counseled for 5 minutes and literature provided. Arrange for follow up: If not quitting follow up in 3 months and continue to offer help. 

## 2022-01-22 ENCOUNTER — Telehealth: Payer: Self-pay

## 2022-01-22 ENCOUNTER — Other Ambulatory Visit: Payer: Self-pay | Admitting: *Deleted

## 2022-01-22 NOTE — Telephone Encounter (Signed)
Patient called and ask for the provider to remove this medicine buPROPion (WELLBUTRIN XL) 150 MG 24 hr tablet  removed from her med list. Patient does not want this.

## 2022-02-14 LAB — CBC WITH DIFFERENTIAL/PLATELET
Basophils Absolute: 0.1 10*3/uL (ref 0.0–0.2)
Basos: 1 %
EOS (ABSOLUTE): 0.1 10*3/uL (ref 0.0–0.4)
Eos: 1 %
Hematocrit: 36.5 % (ref 34.0–46.6)
Hemoglobin: 12.8 g/dL (ref 11.1–15.9)
Immature Grans (Abs): 0 10*3/uL (ref 0.0–0.1)
Immature Granulocytes: 1 %
Lymphocytes Absolute: 2.3 10*3/uL (ref 0.7–3.1)
Lymphs: 43 %
MCH: 33.5 pg — ABNORMAL HIGH (ref 26.6–33.0)
MCHC: 35.1 g/dL (ref 31.5–35.7)
MCV: 96 fL (ref 79–97)
Monocytes Absolute: 0.4 10*3/uL (ref 0.1–0.9)
Monocytes: 8 %
Neutrophils Absolute: 2.4 10*3/uL (ref 1.4–7.0)
Neutrophils: 46 %
Platelets: 385 10*3/uL (ref 150–450)
RBC: 3.82 x10E6/uL (ref 3.77–5.28)
RDW: 17.3 % — ABNORMAL HIGH (ref 11.7–15.4)
WBC: 5.2 10*3/uL (ref 3.4–10.8)

## 2022-02-14 LAB — CMP14+EGFR
ALT: 26 IU/L (ref 0–32)
AST: 24 IU/L (ref 0–40)
Albumin/Globulin Ratio: 1.8 (ref 1.2–2.2)
Albumin: 4.7 g/dL (ref 3.9–4.9)
Alkaline Phosphatase: 69 IU/L (ref 44–121)
BUN/Creatinine Ratio: 16 (ref 9–23)
BUN: 13 mg/dL (ref 6–20)
Bilirubin Total: <0.2 mg/dL (ref 0.0–1.2)
CO2: 23 mmol/L (ref 20–29)
Calcium: 9.5 mg/dL (ref 8.7–10.2)
Chloride: 100 mmol/L (ref 96–106)
Creatinine, Ser: 0.83 mg/dL (ref 0.57–1.00)
Globulin, Total: 2.6 g/dL (ref 1.5–4.5)
Glucose: 145 mg/dL — ABNORMAL HIGH (ref 70–99)
Potassium: 4.4 mmol/L (ref 3.5–5.2)
Sodium: 141 mmol/L (ref 134–144)
Total Protein: 7.3 g/dL (ref 6.0–8.5)
eGFR: 92 mL/min/{1.73_m2} (ref >59)

## 2022-02-14 LAB — LIPID PANEL
Chol/HDL Ratio: 3.2 ratio (ref 0.0–4.4)
Cholesterol, Total: 118 mg/dL (ref 100–199)
HDL: 37 mg/dL — ABNORMAL LOW (ref >39)
LDL Chol Calc (NIH): 22 mg/dL (ref 0–99)
Triglycerides: 427 mg/dL — ABNORMAL HIGH (ref 0–149)
VLDL Cholesterol Cal: 59 mg/dL — ABNORMAL HIGH (ref 5–40)

## 2022-02-14 LAB — HEMOGLOBIN A1C
Est. average glucose Bld gHb Est-mCnc: 157 mg/dL
Hgb A1c MFr Bld: 7.1 % — ABNORMAL HIGH (ref 4.8–5.6)

## 2022-02-14 LAB — TSH: TSH: 1.5 u[IU]/mL (ref 0.450–4.500)

## 2022-02-14 LAB — VITAMIN D 25 HYDROXY (VIT D DEFICIENCY, FRACTURES): Vit D, 25-Hydroxy: 12.3 ng/mL — ABNORMAL LOW (ref 30.0–100.0)

## 2022-02-17 ENCOUNTER — Ambulatory Visit (INDEPENDENT_AMBULATORY_CARE_PROVIDER_SITE_OTHER): Payer: 59 | Admitting: Internal Medicine

## 2022-02-17 ENCOUNTER — Encounter: Payer: Self-pay | Admitting: Internal Medicine

## 2022-02-17 VITALS — BP 128/94 | HR 92 | Resp 18 | Ht 62.0 in | Wt 200.2 lb

## 2022-02-17 DIAGNOSIS — E782 Mixed hyperlipidemia: Secondary | ICD-10-CM

## 2022-02-17 DIAGNOSIS — E1169 Type 2 diabetes mellitus with other specified complication: Secondary | ICD-10-CM

## 2022-02-17 DIAGNOSIS — I10 Essential (primary) hypertension: Secondary | ICD-10-CM | POA: Diagnosis not present

## 2022-02-17 DIAGNOSIS — B360 Pityriasis versicolor: Secondary | ICD-10-CM | POA: Diagnosis not present

## 2022-02-17 DIAGNOSIS — K860 Alcohol-induced chronic pancreatitis: Secondary | ICD-10-CM | POA: Diagnosis not present

## 2022-02-17 MED ORDER — CLOTRIMAZOLE-BETAMETHASONE 1-0.05 % EX CREA: 1.0000 | TOPICAL_CREAM | Freq: Every day | CUTANEOUS | 0 refills | Status: DC

## 2022-02-17 MED ORDER — TELMISARTAN-HCTZ 80-12.5 MG PO TABS: 1.0000 | ORAL_TABLET | Freq: Every day | ORAL | 3 refills | Status: DC

## 2022-02-17 MED ORDER — METFORMIN HCL ER 500 MG PO TB24: 500.0000 mg | ORAL_TABLET | Freq: Every day | ORAL | 3 refills | Status: DC

## 2022-02-17 NOTE — Patient Instructions (Signed)
Please start taking Telmisartan-HCTZ 80-12.5 mg once daily as prescribed instead of 40-12.5 mg.  Please start taking Metformin 500 mg as prescribed.  Please continue to follow low carb diet and perform moderate exercise/walking at least 150 mins/week.  Please take Fish oil 2000 mg twice daily.  Please take Lutricia Feil' health Multivitamin once a day.

## 2022-02-20 NOTE — Assessment & Plan Note (Signed)
Lab Results  Component Value Date   HGBA1C 7.1 (H) 02/13/2022    New onset Started Metformin 500 mg QD Advised to follow diabetic diet On statin and ARB F/u CMP and lipid panel Diabetic foot exam: Today Diabetic eye exam: Advised to follow up with Ophthalmology for diabetic eye exam

## 2022-02-20 NOTE — Assessment & Plan Note (Addendum)
Related to alcohol use and/or hypertriglyceridemia Has had ER visits for pancreatitis, advised to increase fluid intake and avoid alcohol use Needs to follow-up with GI for possible need for EUS Soft diet, advance as tolerated Checked CMP and lipid profile 

## 2022-02-20 NOTE — Assessment & Plan Note (Signed)
Her rash over abdominal wall likely tinea versicolor Lotrisone cream for now

## 2022-02-20 NOTE — Assessment & Plan Note (Signed)
BP Readings from Last 1 Encounters:  02/17/22 (!) 128/94   Uncontrolled with telmisartan- HCTZ 40-12.5 mg QD Increased dose of telmisartan-HCTZ to 80-12.5 mg daily Likely due to alcohol dependence, needs to cut down Counseled for compliance with the medications Advised DASH diet and moderate exercise/walking, at least 150 mins/week

## 2022-02-20 NOTE — Assessment & Plan Note (Signed)
Check lipid profile Had started Crestor -compliance is questionable Advised to take omega-3 Was not able to get Vascepa due to insurance coverage concern 

## 2022-02-20 NOTE — Progress Notes (Signed)
Established Patient Office Visit  Subjective:  Patient ID: Tracy Ortiz, female    DOB: 1980/07/18  Age: 41 y.o. MRN: [id number]  CC:  Chief Complaint  Patient presents with   Follow-up    4 week follow up HTN pt left side has rash been there for awhile itching     HPI Tracy Ortiz is a 41 y.o. female with past medical history of hypertension, sarcoidosis, hyperlipidemia, alcohol and tobacco abuse who presents for f/u of her chronic medical conditions.  HTN: Her BP is elevated today, but is better, compared to prior.  She has been taking medications regularly now.  Type II DM: Her last HbA1c is 7.1.  She has chronic fatigue.  She also has history of alcohol abuse and is trying to cut down slowly.  She admits that she needs to cut down sweet products, especially candies.  She has family history of type II DM-both her parents and her siblings.  She denies polyuria or polyphagia currently.  Recurrent pancreatitis: She has had multiple episodes of pancreatitis, likely due to alcohol dependence.  She currently takes 3-4 drinks of vodka a day and states that she takes it for stress/anxiety.  She acknowledges that she needs to cut down.  She currently denies any abdominal pain, nausea or vomiting.  Her last drink was 2 days ago.  After discussion, she agrees to explore local support groups and is willing to join them.  She has had GI evaluation in the past, but did not follow-up after initial consultation.  She also reports having a rash on left lower abdominal wall, which is dark brownish.  She has itching over the area.  She had similar rash on right inguinal area, which has improved now.  Past Medical History:  Diagnosis Date   HPV (human papilloma virus) infection    Hyperlipidemia    Hypertension    Pancreatitis     History reviewed. No pertinent surgical history.  Family History  Problem Relation Age of Onset   Other Father        healthy   Diabetes Mother    Polycystic  ovary syndrome Sister     Social History   Socioeconomic History   Marital status: Married    Spouse name: Not on file   Number of children: Not on file   Years of education: Not on file   Highest education level: Not on file  Occupational History   Not on file  Tobacco Use   Smoking status: Every Day   Smokeless tobacco: Never  Substance and Sexual Activity   Alcohol use: Yes    Comment: wine daily   Drug use: Never   Sexual activity: Yes    Birth control/protection: Condom  Other Topics Concern   Not on file  Social History Narrative   Not on file   Social Determinants of Health   Financial Resource Strain: Low Risk  (06/14/2020)   Overall Financial Resource Strain (CARDIA)    Difficulty of Paying Living Expenses: Not hard at all  Food Insecurity: No Food Insecurity (06/14/2020)   Hunger Vital Sign    Worried About Running Out of Food in the Last Year: Never true    [place] in the Last Year: Never true  Transportation Needs: No Transportation Needs (06/14/2020)   PRAPARE - Hydrologist (Medical): No    Lack of Transportation (Non-Medical): No  Physical Activity: Sufficiently Active (06/14/2020)   Exercise Vital Sign  Days of Exercise per Week: 3 days    Minutes of Exercise per Session: 60 min  Stress: No Stress Concern Present (06/14/2020)   [place]    Feeling of Stress : Not at all  Social Connections: [place] (06/14/2020)   Social Connection and Isolation Panel [NHANES]    Frequency of Communication with Friends and Family: More than three times a week    Frequency of Social Gatherings with Friends and Family: More than three times a week    Attends Religious Services: More than 4 times per year    Active Member of [employer] or Organizations: No    Attends Music therapist: More than 4 times per year    Marital Status: Married  Arboriculturist Violence: Not At Risk (06/14/2020)   Humiliation, Afraid, Rape, and Kick questionnaire    Fear of Current or Ex-Partner: No    Emotionally Abused: No    Physically Abused: No    Sexually Abused: No    Outpatient Medications Prior to Visit  Medication Sig Dispense Refill   loratadine (CLARITIN) 10 MG tablet Take 10 mg by mouth daily. As needed     pantoprazole (PROTONIX) 40 MG tablet Take 1 tablet (40 mg total) by mouth daily. 30 tablet 5   rosuvastatin (CRESTOR) 10 MG tablet Take 1 tablet (10 mg total) by mouth daily. 90 tablet 1   icosapent Ethyl (VASCEPA) 1 g capsule Take 2 capsules (2 g total) by mouth 2 (two) times daily. 120 capsule 5   telmisartan-hydrochlorothiazide (MICARDIS HCT) 40-12.5 MG tablet Take 1 tablet by mouth daily. 30 tablet 3   No facility-administered medications prior to visit.    No Known Allergies  ROS Review of Systems  Constitutional:  Positive for fatigue. Negative for chills and fever.  HENT:  Negative for congestion, sinus pressure, sinus pain and sore throat.   Eyes:  Negative for pain and discharge.  Respiratory:  Negative for cough and shortness of breath.   Cardiovascular:  Negative for chest pain and palpitations.  Gastrointestinal:  Negative for abdominal pain, constipation, diarrhea, nausea and vomiting.  Endocrine: Negative for polydipsia and polyuria.  Genitourinary:  Negative for dysuria, hematuria, vaginal discharge and vaginal pain.  Musculoskeletal:  Negative for neck pain and neck stiffness.  Skin:  Positive for rash.  Neurological:  Negative for dizziness and weakness.  Psychiatric/Behavioral:  Negative for agitation and behavioral problems.       Objective:    Physical Exam Vitals reviewed.  Constitutional:      General: She is not in acute distress.    Appearance: She is obese. She is not diaphoretic.  HENT:     Head: Normocephalic and atraumatic.     Nose: Nose normal.     Mouth/Throat:     Mouth: Mucous membranes  are moist.  Eyes:     General: No scleral icterus.    Extraocular Movements: Extraocular movements intact.  Cardiovascular:     Rate and Rhythm: Normal rate and regular rhythm.     Pulses: Normal pulses.     Heart sounds: Normal heart sounds. No murmur heard. Pulmonary:     Breath sounds: Normal breath sounds. No wheezing or rales.  Abdominal:     General: Bowel sounds are normal.     Palpations: Abdomen is soft.     Tenderness: There is no abdominal tenderness.  Musculoskeletal:     Cervical back: Neck supple. No tenderness.  Right lower leg: No edema.     Left lower leg: No edema.  Skin:    General: Skin is warm.     Findings: Rash (Dark brownish, circular patch on left lower abdominal wall) present.  Neurological:     General: No focal deficit present.     Mental Status: She is alert and oriented to person, place, and time.     Sensory: No sensory deficit.     Motor: No weakness.  Psychiatric:        Mood and Affect: Mood normal.        Behavior: Behavior normal.     BP (!) 128/94 (BP Location: Right Arm, Cuff Size: Normal)   Pulse 92   Resp 18   Ht _0  (1.575 m)   Wt 200 lb 3.2 oz (90.8 kg)   SpO2 99%   BMI 36.62 kg/m  Wt Readings from Last 3 Encounters:  02/17/22 200 lb 3.2 oz (90.8 kg)  01/20/22 200 lb 3.2 oz (90.8 kg)  07/21/21 197 lb 9.6 oz (89.6 kg)    Lab Results  Component Value Date   TSH 1.500 02/13/2022   Lab Results  Component Value Date   WBC 5.2 02/13/2022   HGB 12.8 02/13/2022   HCT 36.5 02/13/2022   MCV 96 02/13/2022   PLT 385 02/13/2022   Lab Results  Component Value Date   NA 141 02/13/2022   K 4.4 02/13/2022   CO2 23 02/13/2022   GLUCOSE 145 (H) 02/13/2022   BUN 13 02/13/2022   CREATININE 0.83 02/13/2022   BILITOT <0.2 02/13/2022   ALKPHOS 69 02/13/2022   AST 24 02/13/2022   ALT 26 02/13/2022   PROT 7.3 02/13/2022   ALBUMIN 4.7 02/13/2022   CALCIUM 9.5 02/13/2022   EGFR 92 02/13/2022   Lab Results  Component Value  Date   CHOL 118 02/13/2022   Lab Results  Component Value Date   HDL 37 (L) 02/13/2022   Lab Results  Component Value Date   LDLCALC 22 02/13/2022   Lab Results  Component Value Date   TRIG 427 (H) 02/13/2022   Lab Results  Component Value Date   CHOLHDL 3.2 02/13/2022   Lab Results  Component Value Date   HGBA1C 7.1 (H) 02/13/2022      Assessment & Plan:   Problem List Items Addressed This Visit       Cardiovascular and Mediastinum   Primary hypertension - Primary    BP Readings from Last 1 Encounters:  02/17/22 (!) 128/94  Uncontrolled with telmisartan- HCTZ 40-12.5 mg QD Increased dose of telmisartan-HCTZ to 80-12.5 mg daily Likely due to alcohol dependence, needs to cut down Counseled for compliance with the medications Advised DASH diet and moderate exercise/walking, at least 150 mins/week      Relevant Medications   telmisartan-hydrochlorothiazide (MICARDIS HCT) 80-12.5 MG tablet     Digestive   Chronic pancreatitis (HCC)    Related to alcohol use and/or hypertriglyceridemia Has had ER visits for pancreatitis, advised to increase fluid intake and avoid alcohol use Needs to follow-up with GI for possible need for EUS Soft diet, advance as tolerated Checked CMP and lipid profile        Endocrine   Type 2 diabetes mellitus with other specified complication (HCC)    Lab Results  Component Value Date   HGBA1C 7.1 (H) 02/13/2022   New onset Started Metformin 500 mg QD Advised to follow diabetic diet On statin and ARB F/u CMP and  lipid panel Diabetic foot exam: Today Diabetic eye exam: Advised to follow up with Ophthalmology for diabetic eye exam      Relevant Medications   metFORMIN (GLUCOPHAGE-XR) 500 MG 24 hr tablet   telmisartan-hydrochlorothiazide (MICARDIS HCT) 80-12.5 MG tablet   Other Relevant Orders   CMP14+EGFR   Hemoglobin A1c   Urine Microalbumin w/creat. ratio     Musculoskeletal and Integument   Tinea versicolor    Her rash  over abdominal wall likely tinea versicolor Lotrisone cream for now      Relevant Medications   clotrimazole-betamethasone (LOTRISONE) cream     Other   Hyperlipidemia    Check lipid profile Had started Crestor -compliance is questionable Advised to take omega-3 Was not able to get Vascepa due to insurance coverage concern      Relevant Medications   telmisartan-hydrochlorothiazide (MICARDIS HCT) 80-12.5 MG tablet   Other Relevant Orders   Lipid Profile    Meds ordered this encounter  Medications   clotrimazole-betamethasone (LOTRISONE) cream    Sig: Apply 1 Application topically daily.    Dispense:  30 g    Refill:  0   metFORMIN (GLUCOPHAGE-XR) 500 MG 24 hr tablet    Sig: Take 1 tablet (500 mg total) by mouth daily with breakfast.    Dispense:  30 tablet    Refill:  3   telmisartan-hydrochlorothiazide (MICARDIS HCT) 80-12.5 MG tablet    Sig: Take 1 tablet by mouth daily.    Dispense:  30 tablet    Refill:  3    Dose change    Follow-up: Return in about 3 months (around 05/20/2022) for DM and HTN.    Lindell Spar, MD

## 2022-03-30 ENCOUNTER — Encounter: Payer: Self-pay | Admitting: Internal Medicine

## 2022-03-30 ENCOUNTER — Ambulatory Visit (INDEPENDENT_AMBULATORY_CARE_PROVIDER_SITE_OTHER): Payer: 59 | Admitting: Internal Medicine

## 2022-03-30 DIAGNOSIS — J398 Other specified diseases of upper respiratory tract: Secondary | ICD-10-CM

## 2022-03-30 DIAGNOSIS — J309 Allergic rhinitis, unspecified: Secondary | ICD-10-CM | POA: Insufficient documentation

## 2022-03-30 MED ORDER — IPRATROPIUM BROMIDE 0.03 % NA SOLN: 2.0000 | Freq: Two times a day (BID) | NASAL | 0 refills | Status: DC | PRN

## 2022-03-30 NOTE — Patient Instructions (Signed)
Thank you for trusting me with your care. To recap, today we discussed the following:   - Continue Mucinex, 1200 mg twice daily - Start using nasal rinses - Use nasal spray prescribed - Follow up if not improving or developing new symptoms.

## 2022-03-30 NOTE — Progress Notes (Signed)
     CC: Nasal Congestion and Cough (Productive at times- Started Thursday night- took mucinex, dayquil, has made throat sore from cough, no fever)    HPI:[name] is a 41 y.o. female who presents for evaluation of nasal congestion and cough. For the details of today's visit, please refer to the assessment and plan.  Past Medical History:  Diagnosis Date   HPV (human papilloma virus) infection    Hyperlipidemia    Hypertension    Pancreatitis      Assessment & Plan:   Congestion of upper respiratory tract Patient presented today via telehealth with concern for nasal congestion and cough. This began Thursday. She has tried mucinex and dayquil , but continues to have symptoms. She takes claritin for allergies and symptoms have been well controlled. Last treated for bacterial sinusitis a year ago in August. These symptoms are perennial with seasonal exacerbation. No known precipitate for allergies. The patient has tried over the counter medications with inadequate relief of symptoms ( mucinex and dayquil). No malaise or fever.  - Continue Mucinex, 1200 mg twice daily - Start using nasal rinses - ipratropium (ATROVENT) 0.03 % nasal spray; Place 2 sprays into both nostrils 2 (two) times daily as needed for rhinitis.  Dispense: 30 mL; Refill: 0-  - Follow up if not improving or developing new symptoms  Time:   Today, I have spent 15 minutes with telehealth technology discussing the above problems.  Milus Banister, MD

## 2022-03-30 NOTE — Assessment & Plan Note (Signed)
Patient presented today via telehealth with concern for nasal congestion and cough. This began Thursday. She has tried mucinex and dayquil , but continues to have symptoms. She takes claritin for allergies and symptoms have been well controlled. Last treated for bacterial sinusitis a year ago in August. These symptoms are perennial with seasonal exacerbation. No known precipitate for allergies. The patient has tried over the counter medications with inadequate relief of symptoms ( mucinex and dayquil). No malaise or fever.  - Continue Mucinex, 1200 mg twice daily - Start using nasal rinses - ipratropium (ATROVENT) 0.03 % nasal spray; Place 2 sprays into both nostrils 2 (two) times daily as needed for rhinitis.  Dispense: 30 mL; Refill: 0-  - Follow up if not improving or developing new symptoms

## 2022-03-30 NOTE — Assessment & Plan Note (Deleted)
Patient presented today with concern for nasal congestion and cough. This began Thursday. She has tried mucinex and dayquil , but continues to have symptoms. She takes claritin for allergies and symptoms have been well controlled. Last treated for bacterial sinusitis a year ago in August. These symptoms are perennial with seasonal exacerbation. No known precipitate for allergies. The patient has tried over the counter medications with inadequate relief of symptoms ( mucinex and dayquil). No malaise or fever.

## 2022-04-07 ENCOUNTER — Telehealth: Payer: Self-pay | Admitting: Internal Medicine

## 2022-04-07 NOTE — Telephone Encounter (Signed)
scheduled

## 2022-04-07 NOTE — Telephone Encounter (Signed)
Patient called said the phone visit had with Dr Barbaraann Faster on 03/30/2022 still having issues with cold. Needs her FMLA extended dates, will be faxing the forms.   Question # 8 needs to be updated. Does patient need a visit with Dr Allena Katz

## 2022-04-09 ENCOUNTER — Ambulatory Visit (INDEPENDENT_AMBULATORY_CARE_PROVIDER_SITE_OTHER): Payer: 59 | Admitting: Internal Medicine

## 2022-04-09 ENCOUNTER — Encounter: Payer: Self-pay | Admitting: Internal Medicine

## 2022-04-09 ENCOUNTER — Other Ambulatory Visit: Payer: Self-pay | Admitting: Internal Medicine

## 2022-04-09 DIAGNOSIS — Z114 Encounter for screening for human immunodeficiency virus [HIV]: Secondary | ICD-10-CM

## 2022-04-09 DIAGNOSIS — K860 Alcohol-induced chronic pancreatitis: Secondary | ICD-10-CM | POA: Diagnosis not present

## 2022-04-09 DIAGNOSIS — Z1159 Encounter for screening for other viral diseases: Secondary | ICD-10-CM

## 2022-04-09 DIAGNOSIS — J0100 Acute maxillary sinusitis, unspecified: Secondary | ICD-10-CM

## 2022-04-09 MED ORDER — AMOXICILLIN-POT CLAVULANATE 875-125 MG PO TABS: 1.0000 | ORAL_TABLET | Freq: Two times a day (BID) | ORAL | 0 refills | Status: DC

## 2022-04-09 NOTE — Assessment & Plan Note (Signed)
Related to alcohol use and/or hypertriglyceridemia Has had ER visits for pancreatitis, advised to increase fluid intake and avoid alcohol use Needs to follow-up with GI for possible need for EUS Soft diet, advance as tolerated Checked CMP and lipid profile 

## 2022-04-09 NOTE — Progress Notes (Signed)
Virtual Visit via Telephone Note   This visit type was conducted via telephone. This format is felt to be most appropriate for this patient at this time.  The patient did not have access to video technology/had technical difficulties with video requiring transitioning to audio format only (telephone).  All issues noted in this document were discussed and addressed.  No physical exam could be performed with this format.  Evaluation Performed:  Follow-up visit  Date:  04/09/2022   ID:  Tracy Ortiz, DOB 04/20/1981, MRN [id number]  Patient Location: Home Provider Location: Office/Clinic  Participants: Patient Location of Patient: Home Location of Provider: Telehealth Consent was obtain for visit to be over via telehealth. I verified that I am speaking with the correct person using two identifiers.  PCP:  Anabel Halon, MD   Chief Complaint: Nasal congestion, cough and sinus pressure  History of Present Illness:    Tracy Ortiz is a 41 y.o. female who has a televisit for complaint of nasal congestion, cough and sinus pressure for the last 2 weeks.  She was given symptomatic treatment in the last week with Mucinex and Atrovent nasal spray, which has not helped her.  She had to miss work for the whole week due to severe fatigue.  She denies any dyspnea or wheezing currently.  She also has history of recurrent pancreatitis, and requests renewal of FMLA paperwork.  The patient does not have symptoms concerning for COVID-19 infection (fever, chills, cough, or new shortness of breath).   Past Medical, Surgical, Social History, Allergies, and Medications have been Reviewed.  Past Medical History:  Diagnosis Date   HPV (human papilloma virus) infection    Hyperlipidemia    Hypertension    Pancreatitis    No past surgical history on file.   Current Meds  Medication Sig   amoxicillin-clavulanate (AUGMENTIN) 875-125 MG tablet Take 1 tablet by mouth 2 (two) times daily.      Allergies:   Patient has no known allergies.   ROS:   Please see the history of present illness.     All other systems reviewed and are negative.   Labs/Other Tests and Data Reviewed:    Recent Labs: 02/13/2022: ALT 26; BUN 13; Creatinine, Ser 0.83; Hemoglobin 12.8; Platelets 385; Potassium 4.4; Sodium 141; TSH 1.500   Recent Lipid Panel Lab Results  Component Value Date/Time   CHOL 118 02/13/2022 01:36 PM   TRIG 427 (H) 02/13/2022 01:36 PM   HDL 37 (L) 02/13/2022 01:36 PM   CHOLHDL 3.2 02/13/2022 01:36 PM   LDLCALC 22 02/13/2022 01:36 PM    Wt Readings from Last 3 Encounters:  02/17/22 200 lb 3.2 oz (90.8 kg)  01/20/22 200 lb 3.2 oz (90.8 kg)  07/21/21 197 lb 9.6 oz (89.6 kg)     ASSESSMENT & PLAN:    Acute sinusitis Started empiric Augmentin as she has had persistent symptoms despite symptomatic treatment Continue Atrovent nasal spray as needed for nasal congestion FMLA paperwork updated  Chronic pancreatitis (HCC) Related to alcohol use and/or hypertriglyceridemia Has had ER visits for pancreatitis, advised to increase fluid intake and avoid alcohol use Needs to follow-up with GI for possible need for EUS Soft diet, advance as tolerated Checked CMP and lipid profile    Time:   Today, I have spent 12 minutes reviewing the chart, including problem list, medications, and with the patient with telehealth technology discussing the above problems.   Medication Adjustments/Labs and Tests Ordered: Current medicines are reviewed  at length with the patient today.  Concerns regarding medicines are outlined above.   Tests Ordered: No orders of the defined types were placed in this encounter.   Medication Changes: Meds ordered this encounter  Medications   amoxicillin-clavulanate (AUGMENTIN) 875-125 MG tablet    Sig: Take 1 tablet by mouth 2 (two) times daily.    Dispense:  14 tablet    Refill:  0     Note: This dictation was prepared with Dragon dictation  along with smaller phrase technology. Similar sounding words can be transcribed inadequately or may not be corrected upon review. Any transcriptional errors that result from this process are unintentional.      Disposition:  Follow up  Signed, Anabel Halon, MD  04/09/2022 12:17 PM     [facility]

## 2022-04-14 ENCOUNTER — Telehealth: Payer: Self-pay | Admitting: Internal Medicine

## 2022-04-14 NOTE — Telephone Encounter (Signed)
Patient will fax [place] the forms, and we can fill them out

## 2022-04-14 NOTE — Telephone Encounter (Signed)
Patient said received forms on 12/14 forms does not have the additional information for that particular week.  Can patient bring the form back and re-document the dates. Please contact you [phone]. NEED FOR WORK ASAP

## 2022-04-15 ENCOUNTER — Telehealth: Payer: Self-pay | Admitting: Internal Medicine

## 2022-04-15 DIAGNOSIS — Z0279 Encounter for issue of other medical certificate: Secondary | ICD-10-CM

## 2022-04-15 NOTE — Telephone Encounter (Signed)
[place]   Coped Sleeved noted

## 2022-05-17 LAB — MICROALBUMIN / CREATININE URINE RATIO
Creatinine, Urine: 90.9 mg/dL
Microalb/Creat Ratio: 23 mg/g creat (ref 0–29)
Microalbumin, Urine: 21.1 ug/mL

## 2022-05-17 LAB — LIPID PANEL
Chol/HDL Ratio: 3 ratio (ref 0.0–4.4)
Cholesterol, Total: 121 mg/dL (ref 100–199)
HDL: 40 mg/dL (ref >39)
LDL Chol Calc (NIH): 21 mg/dL (ref 0–99)
Triglycerides: 435 mg/dL — ABNORMAL HIGH (ref 0–149)
VLDL Cholesterol Cal: 60 mg/dL — ABNORMAL HIGH (ref 5–40)

## 2022-05-17 LAB — CMP14+EGFR
ALT: 25 IU/L (ref 0–32)
AST: 31 IU/L (ref 0–40)
Albumin/Globulin Ratio: 2 (ref 1.2–2.2)
Albumin: 5.1 g/dL — ABNORMAL HIGH (ref 3.9–4.9)
Alkaline Phosphatase: 61 IU/L (ref 44–121)
BUN/Creatinine Ratio: 14 (ref 9–23)
BUN: 13 mg/dL (ref 6–20)
Bilirubin Total: 0.6 mg/dL (ref 0.0–1.2)
CO2: 23 mmol/L (ref 20–29)
Calcium: 9.6 mg/dL (ref 8.7–10.2)
Chloride: 94 mmol/L — ABNORMAL LOW (ref 96–106)
Creatinine, Ser: 0.92 mg/dL (ref 0.57–1.00)
Globulin, Total: 2.6 g/dL (ref 1.5–4.5)
Glucose: 91 mg/dL (ref 70–99)
Potassium: 3.7 mmol/L (ref 3.5–5.2)
Sodium: 135 mmol/L (ref 134–144)
Total Protein: 7.7 g/dL (ref 6.0–8.5)
eGFR: 81 mL/min/{1.73_m2} (ref >59)

## 2022-05-17 LAB — HEMOGLOBIN A1C
Est. average glucose Bld gHb Est-mCnc: 137 mg/dL
Hgb A1c MFr Bld: 6.4 % — ABNORMAL HIGH (ref 4.8–5.6)

## 2022-05-21 ENCOUNTER — Encounter: Payer: Self-pay | Admitting: Internal Medicine

## 2022-05-21 ENCOUNTER — Ambulatory Visit (INDEPENDENT_AMBULATORY_CARE_PROVIDER_SITE_OTHER): Payer: 59 | Admitting: Internal Medicine

## 2022-05-21 ENCOUNTER — Telehealth: Payer: Self-pay | Admitting: Internal Medicine

## 2022-05-21 VITALS — BP 132/88 | HR 121 | Ht 62.0 in | Wt 197.2 lb

## 2022-05-21 DIAGNOSIS — E1169 Type 2 diabetes mellitus with other specified complication: Secondary | ICD-10-CM | POA: Diagnosis not present

## 2022-05-21 DIAGNOSIS — Z0279 Encounter for issue of other medical certificate: Secondary | ICD-10-CM

## 2022-05-21 DIAGNOSIS — K860 Alcohol-induced chronic pancreatitis: Secondary | ICD-10-CM

## 2022-05-21 DIAGNOSIS — Z720 Tobacco use: Secondary | ICD-10-CM

## 2022-05-21 DIAGNOSIS — I10 Essential (primary) hypertension: Secondary | ICD-10-CM | POA: Diagnosis not present

## 2022-05-21 DIAGNOSIS — E785 Hyperlipidemia, unspecified: Secondary | ICD-10-CM

## 2022-05-21 MED ORDER — ROSUVASTATIN CALCIUM 10 MG PO TABS: 10.0000 mg | ORAL_TABLET | Freq: Every day | ORAL | 3 refills | Status: DC

## 2022-05-21 MED ORDER — TELMISARTAN-HCTZ 80-12.5 MG PO TABS: 1.0000 | ORAL_TABLET | Freq: Every day | ORAL | 1 refills | Status: DC

## 2022-05-21 MED ORDER — METFORMIN HCL ER 500 MG PO TB24: 500.0000 mg | ORAL_TABLET | Freq: Every day | ORAL | 3 refills | Status: DC

## 2022-05-21 NOTE — Telephone Encounter (Signed)
Pt faxed in forms to be completed for days oow due to illness  Has appt today 05/21/22    Noted/copied/sleeved/given to provider

## 2022-05-21 NOTE — Assessment & Plan Note (Signed)
Check lipid profile Had started Crestor -compliance is questionable Advised to take omega-3 Was not able to get Vascepa due to insurance coverage concern

## 2022-05-21 NOTE — Progress Notes (Signed)
Established Patient Office Visit  Subjective:  Patient ID: Tracy Ortiz, female    DOB: 12-27-1980  Age: 42 y.o. MRN: [id number]  CC:  Chief Complaint  Patient presents with   Hypertension    Three month follow up on hypertension and diabetes    HPI Tracy Ortiz is a 42 y.o. female with past medical history of hypertension, sarcoidosis, hyperlipidemia, alcohol and tobacco abuse who presents for f/u of her chronic medical conditions.  HTN: Her BP is elevated today, but is better compared to prior.  She has been taking medications regularly now.  She denies any headache, dizziness, chest pain, dyspnea.  She has intermittent palpitations, likely due to alcohol withdrawal.  Type II DM: Her HbA1c is 6.4 now. She is taking Metformin now. She has chronic fatigue.  She also has history of alcohol abuse and is trying to cut down slowly.  She admits that she needs to cut down sweet products, especially candies.  She has family history of type II DM-both her parents and her siblings.  She denies polyuria or polyphagia currently.  Recurrent pancreatitis: She has had multiple episodes of pancreatitis, likely due to alcohol dependence.  She currently takes 2-3 drinks of vodka a day and states that she takes it for stress/anxiety.  She acknowledges that she needs to cut down.  She currently denies any abdominal pain, nausea or vomiting.  Her last drink was yesterday.  After discussion, she agrees to explore local support groups and is willing to join them.  She has had GI evaluation in the past, but did not follow-up after initial consultation.  She has brought FMLA paperwork, which has been filled out.  Past Medical History:  Diagnosis Date   HPV (human papilloma virus) infection    Hyperlipidemia    Hypertension    Pancreatitis     History reviewed. No pertinent surgical history.  Family History  Problem Relation Age of Onset   Other Father        healthy   Diabetes Mother     Polycystic ovary syndrome Sister     Social History   Socioeconomic History   Marital status: Married    Spouse name: Not on file   Number of children: Not on file   Years of education: Not on file   Highest education level: Not on file  Occupational History   Not on file  Tobacco Use   Smoking status: Every Day   Smokeless tobacco: Never  Substance and Sexual Activity   Alcohol use: Yes    Comment: wine daily   Drug use: Never   Sexual activity: Yes    Birth control/protection: Condom  Other Topics Concern   Not on file  Social History Narrative   Not on file   Social Determinants of Health   Financial Resource Strain: Low Risk  (06/14/2020)   Overall Financial Resource Strain (CARDIA)    Difficulty of Paying Living Expenses: Not hard at all  Food Insecurity: No Food Insecurity (06/14/2020)   Hunger Vital Sign    Worried About Running Out of Food in the Last Year: Never true    [place] in the Last Year: Never true  Transportation Needs: No Transportation Needs (06/14/2020)   PRAPARE - Hydrologist (Medical): No    Lack of Transportation (Non-Medical): No  Physical Activity: Sufficiently Active (06/14/2020)   Exercise Vital Sign    Days of Exercise per Week: 3 days  Minutes of Exercise per Session: 60 min  Stress: No Stress Concern Present (06/14/2020)   [place]    Feeling of Stress : Not at all  Social Connections: Rich (06/14/2020)   Social Connection and Isolation Panel [NHANES]    Frequency of Communication with Friends and Family: More than three times a week    Frequency of Social Gatherings with Friends and Family: More than three times a week    Attends Religious Services: More than 4 times per year    Active Member of [employer] or Organizations: No    Attends Music therapist: More than 4 times per year    Marital Status: Married   Human resources officer Violence: Not At Risk (06/14/2020)   Humiliation, Afraid, Rape, and Kick questionnaire    Fear of Current or Ex-Partner: No    Emotionally Abused: No    Physically Abused: No    Sexually Abused: No    Outpatient Medications Prior to Visit  Medication Sig Dispense Refill   clotrimazole-betamethasone (LOTRISONE) cream Apply 1 Application topically daily. (Patient not taking: Reported on 03/30/2022) 30 g 0   ipratropium (ATROVENT) 0.03 % nasal spray Place 2 sprays into both nostrils 2 (two) times daily as needed for rhinitis. 30 mL 0   loratadine (CLARITIN) 10 MG tablet Take 10 mg by mouth daily. As needed     pantoprazole (PROTONIX) 40 MG tablet Take 1 tablet (40 mg total) by mouth daily. 30 tablet 5   amoxicillin-clavulanate (AUGMENTIN) 875-125 MG tablet Take 1 tablet by mouth 2 (two) times daily. 14 tablet 0   metFORMIN (GLUCOPHAGE-XR) 500 MG 24 hr tablet Take 1 tablet (500 mg total) by mouth daily with breakfast. 30 tablet 3   rosuvastatin (CRESTOR) 10 MG tablet Take 1 tablet (10 mg total) by mouth daily. 90 tablet 1   telmisartan-hydrochlorothiazide (MICARDIS HCT) 80-12.5 MG tablet Take 1 tablet by mouth daily. 30 tablet 3   No facility-administered medications prior to visit.    No Known Allergies  ROS Review of Systems  Constitutional:  Positive for fatigue. Negative for chills and fever.  HENT:  Negative for congestion, sinus pressure, sinus pain and sore throat.   Eyes:  Negative for pain and discharge.  Respiratory:  Negative for cough and shortness of breath.   Cardiovascular:  Positive for palpitations. Negative for chest pain.  Gastrointestinal:  Negative for abdominal pain, constipation, diarrhea, nausea and vomiting.  Endocrine: Negative for polydipsia and polyuria.  Genitourinary:  Negative for dysuria, hematuria, vaginal discharge and vaginal pain.  Musculoskeletal:  Negative for neck pain and neck stiffness.  Skin:  Negative for rash.  Neurological:   Negative for dizziness and weakness.  Psychiatric/Behavioral:  Negative for agitation and behavioral problems.       Objective:    Physical Exam Vitals reviewed.  Constitutional:      General: She is not in acute distress.    Appearance: She is obese. She is not diaphoretic.  HENT:     Head: Normocephalic and atraumatic.     Nose: Nose normal.     Mouth/Throat:     Mouth: Mucous membranes are moist.  Eyes:     General: No scleral icterus.    Extraocular Movements: Extraocular movements intact.  Cardiovascular:     Rate and Rhythm: Normal rate and regular rhythm.     Pulses: Normal pulses.     Heart sounds: Normal heart sounds. No murmur heard. Pulmonary:  Breath sounds: Normal breath sounds. No wheezing or rales.  Musculoskeletal:     Cervical back: Neck supple. No tenderness.     Right lower leg: No edema.     Left lower leg: No edema.  Skin:    General: Skin is warm.     Findings: No rash.  Neurological:     General: No focal deficit present.     Mental Status: She is alert and oriented to person, place, and time.     Sensory: No sensory deficit.     Motor: No weakness.  Psychiatric:        Mood and Affect: Mood normal.        Behavior: Behavior normal.     BP 132/88 (BP Location: Left Arm, Cuff Size: Normal)   Pulse (!) 121   Ht 5\' 2"  (1.575 m)   Wt 197 lb 3.2 oz (89.4 kg)   SpO2 98%   BMI 36.07 kg/m  Wt Readings from Last 3 Encounters:  05/21/22 197 lb 3.2 oz (89.4 kg)  02/17/22 200 lb 3.2 oz (90.8 kg)  01/20/22 200 lb 3.2 oz (90.8 kg)    Lab Results  Component Value Date   TSH 1.500 02/13/2022   Lab Results  Component Value Date   WBC 5.2 02/13/2022   HGB 12.8 02/13/2022   HCT 36.5 02/13/2022   MCV 96 02/13/2022   PLT 385 02/13/2022   Lab Results  Component Value Date   NA 135 05/15/2022   K 3.7 05/15/2022   CO2 23 05/15/2022   GLUCOSE 91 05/15/2022   BUN 13 05/15/2022   CREATININE 0.92 05/15/2022   BILITOT 0.6 05/15/2022    ALKPHOS 61 05/15/2022   AST 31 05/15/2022   ALT 25 05/15/2022   PROT 7.7 05/15/2022   ALBUMIN 5.1 (H) 05/15/2022   CALCIUM 9.6 05/15/2022   EGFR 81 05/15/2022   Lab Results  Component Value Date   CHOL 121 05/15/2022   Lab Results  Component Value Date   HDL 40 05/15/2022   Lab Results  Component Value Date   LDLCALC 21 05/15/2022   Lab Results  Component Value Date   TRIG 435 (H) 05/15/2022   Lab Results  Component Value Date   CHOLHDL 3.0 05/15/2022   Lab Results  Component Value Date   HGBA1C 6.4 (H) 05/15/2022      Assessment & Plan:   Problem List Items Addressed This Visit       Cardiovascular and Mediastinum   Primary hypertension - Primary    BP Readings from Last 1 Encounters:  05/21/22 132/88  Well-controlled with  telmisartan-HCTZ to 80-12.5 mg daily Likely due to alcohol dependence, needs to cut down Counseled for compliance with the medications Advised DASH diet and moderate exercise/walking, at least 150 mins/week      Relevant Medications   telmisartan-hydrochlorothiazide (MICARDIS HCT) 80-12.5 MG tablet   rosuvastatin (CRESTOR) 10 MG tablet     Digestive   Chronic pancreatitis (HCC)    Related to alcohol use and/or hypertriglyceridemia Has had ER visits for pancreatitis, advised to increase fluid intake and avoid alcohol use Needs to follow-up with GI for possible need for EUS Soft diet, advance as tolerated Checked CMP and lipid profile        Endocrine   Type 2 diabetes mellitus with other specified complication (HCC)    Lab Results  Component Value Date   HGBA1C 6.4 (H) 05/15/2022  Associated with HTN and HLD Well-controlled On Metformin 500 mg  QD Advised to follow diabetic diet On statin and ARB F/u CMP and lipid panel Diabetic eye exam: Advised to follow up with Ophthalmology for diabetic eye exam      Relevant Medications   metFORMIN (GLUCOPHAGE-XR) 500 MG 24 hr tablet   telmisartan-hydrochlorothiazide (MICARDIS HCT)  80-12.5 MG tablet   rosuvastatin (CRESTOR) 10 MG tablet   Other Relevant Orders   Ambulatory referral to Ophthalmology     Other   Hyperlipidemia    Check lipid profile Had started Crestor -compliance is questionable Advised to take omega-3 Was not able to get Vascepa due to insurance coverage concern      Relevant Medications   telmisartan-hydrochlorothiazide (MICARDIS HCT) 80-12.5 MG tablet   rosuvastatin (CRESTOR) 10 MG tablet   Tobacco abuse    Asked about quitting: confirms that she currently smokes cigarettes Advise to quit smoking: Educated about QUITTING to reduce the risk of cancer, cardio and cerebrovascular disease. Assess willingness: Unwilling to quit at this time, but is working on cutting back. Assist with counseling and pharmacotherapy: Counseled for 5 minutes and literature provided. Arrange for follow up: If not quitting follow up in 3 months and continue to offer help.         Meds ordered this encounter  Medications   metFORMIN (GLUCOPHAGE-XR) 500 MG 24 hr tablet    Sig: Take 1 tablet (500 mg total) by mouth daily with breakfast.    Dispense:  90 tablet    Refill:  3   telmisartan-hydrochlorothiazide (MICARDIS HCT) 80-12.5 MG tablet    Sig: Take 1 tablet by mouth daily.    Dispense:  90 tablet    Refill:  1    Dose change   rosuvastatin (CRESTOR) 10 MG tablet    Sig: Take 1 tablet (10 mg total) by mouth daily.    Dispense:  90 tablet    Refill:  3    Follow-up: Return in about 4 months (around 09/19/2022) for DM and HTN.    Anabel Halon, MD

## 2022-05-21 NOTE — Assessment & Plan Note (Signed)
Asked about quitting: confirms that she currently smokes cigarettes Advise to quit smoking: Educated about QUITTING to reduce the risk of cancer, cardio and cerebrovascular disease. Assess willingness: Unwilling to quit at this time, but is working on cutting back. Assist with counseling and pharmacotherapy: Counseled for 5 minutes and literature provided. Arrange for follow up: If not quitting follow up in 3 months and continue to offer help.

## 2022-05-21 NOTE — Assessment & Plan Note (Signed)
BP Readings from Last 1 Encounters:  05/21/22 132/88   Well-controlled with  telmisartan-HCTZ to 80-12.5 mg daily Likely due to alcohol dependence, needs to cut down Counseled for compliance with the medications Advised DASH diet and moderate exercise/walking, at least 150 mins/week

## 2022-05-21 NOTE — Assessment & Plan Note (Signed)
Related to alcohol use and/or hypertriglyceridemia Has had ER visits for pancreatitis, advised to increase fluid intake and avoid alcohol use Needs to follow-up with GI for possible need for EUS Soft diet, advance as tolerated Checked CMP and lipid profile

## 2022-05-21 NOTE — Assessment & Plan Note (Signed)
Lab Results  Component Value Date   HGBA1C 6.4 (H) 05/15/2022   Associated with HTN and HLD Well-controlled On Metformin 500 mg QD Advised to follow diabetic diet On statin and ARB F/u CMP and lipid panel Diabetic eye exam: Advised to follow up with Ophthalmology for diabetic eye exam

## 2022-05-21 NOTE — Patient Instructions (Signed)
Please start taking Fish oil 2 tablets twice daily.  Please continue taking other medications as prescribed.  Please continue to follow low carb diet and ambulate as tolerated.

## 2022-08-07 ENCOUNTER — Other Ambulatory Visit: Payer: Self-pay | Admitting: Internal Medicine

## 2022-08-07 DIAGNOSIS — K219 Gastro-esophageal reflux disease without esophagitis: Secondary | ICD-10-CM

## 2022-08-28 ENCOUNTER — Encounter: Payer: Self-pay | Admitting: Internal Medicine

## 2022-08-28 ENCOUNTER — Ambulatory Visit (INDEPENDENT_AMBULATORY_CARE_PROVIDER_SITE_OTHER): Payer: 59 | Admitting: Internal Medicine

## 2022-08-28 VITALS — BP 132/78 | HR 110 | Ht 62.0 in | Wt 196.6 lb

## 2022-08-28 DIAGNOSIS — N2889 Other specified disorders of kidney and ureter: Secondary | ICD-10-CM

## 2022-08-28 DIAGNOSIS — K852 Alcohol induced acute pancreatitis without necrosis or infection: Secondary | ICD-10-CM | POA: Diagnosis not present

## 2022-08-28 DIAGNOSIS — Z09 Encounter for follow-up examination after completed treatment for conditions other than malignant neoplasm: Secondary | ICD-10-CM | POA: Diagnosis not present

## 2022-08-28 DIAGNOSIS — F10288 Alcohol dependence with other alcohol-induced disorder: Secondary | ICD-10-CM

## 2022-08-28 DIAGNOSIS — K219 Gastro-esophageal reflux disease without esophagitis: Secondary | ICD-10-CM

## 2022-08-28 MED ORDER — PANTOPRAZOLE SODIUM 40 MG PO TBEC: 40.0000 mg | DELAYED_RELEASE_TABLET | Freq: Every day | ORAL | 5 refills | Status: DC

## 2022-08-28 NOTE — Assessment & Plan Note (Signed)
ER chart reviewed including imaging Acute pancreatitis, symptoms improved with hydration and Phenergan

## 2022-08-28 NOTE — Patient Instructions (Signed)
Please take Phenergan as needed for nausea.  Please take Pantoprazole for acid reflux and abdominal pain.  Please continue to take medications as prescribed.  Please continue to follow low carb diet and perform moderate exercise/walking at least 150 mins/week.

## 2022-08-28 NOTE — Assessment & Plan Note (Signed)
Uncontrolled with Pepcid Had switched to Protonix Can take Pepcid in the evening as needed

## 2022-08-28 NOTE — Assessment & Plan Note (Signed)
Recent CT abdomen showed exophytic renal mass - 1.8 cm in size Previous CT abdomen in 07/23 had reported renal cyst, which was described benign Check MRI abdomen as recommended follow up for renal mass

## 2022-08-28 NOTE — Assessment & Plan Note (Signed)
Has had recurrent acute pancreatitis due to alcohol use Needs to maintain adequate hydration for now Advance diet as tolerated Needs to strictly avoid alcohol use

## 2022-08-28 NOTE — Assessment & Plan Note (Signed)
Used to take 7-8 drinks of vodka every day, has been cutting down H/o recurrent pancreatitis, f/u CMP Advised to quit alcohol use - support group information provided

## 2022-08-28 NOTE — Progress Notes (Signed)
Established Patient Office Visit  Subjective:  Patient ID: Tracy Ortiz, female    DOB: 21-Dec-1980  Age: 42 y.o. MRN: [id number]  CC:  Chief Complaint  Patient presents with   Follow-up    ER follow up for pancreatitis     HPI Denise Grbic is a 42 y.o. female with past medical history of hypertension, type 2 DM, sarcoidosis, hyperlipidemia, alcohol and tobacco abuse who presents for f/u of recent ER visit on 04/27 for acute pancreatitis.  She has had recurrent episodes of alcohol induced pancreatitis.  She had alcoholic drinks for 2 days prior to her ER visit, but is unable to quantify the count of drinks.  She had severe epigastric pain, nausea and vomiting.  She had CT abdomen, which confirmed acute pancreatitis and also showed an exophytic renal mass.  She was given Norco, Phenergan and cefdinir from the ER, and has noticed improvement in her abdominal pain.  Of note, she also takes Protonix for GERD.  Denies any melena or hematochezia.  She is trying to cut down her alcohol use.  Past Medical History:  Diagnosis Date   HPV (human papilloma virus) infection    Hyperlipidemia    Hypertension    Pancreatitis     History reviewed. No pertinent surgical history.  Family History  Problem Relation Age of Onset   Other Father        healthy   Diabetes Mother    Polycystic ovary syndrome Sister     Social History   Socioeconomic History   Marital status: Married    Spouse name: Not on file   Number of children: Not on file   Years of education: Not on file   Highest education level: Not on file  Occupational History   Not on file  Tobacco Use   Smoking status: Every Day   Smokeless tobacco: Never  Substance and Sexual Activity   Alcohol use: Yes    Comment: wine daily   Drug use: Never   Sexual activity: Yes    Birth control/protection: Condom  Other Topics Concern   Not on file  Social History Narrative   Not on file   Social Determinants of Health    Financial Resource Strain: Low Risk  (06/14/2020)   Overall Financial Resource Strain (CARDIA)    Difficulty of Paying Living Expenses: Not hard at all  Food Insecurity: No Food Insecurity (06/14/2020)   Hunger Vital Sign    Worried About Running Out of Food in the Last Year: Never true    Ran Out of Food in the Last Year: Never true  Transportation Needs: No Transportation Needs (06/14/2020)   PRAPARE - Administrator, Civil Service (Medical): No    Lack of Transportation (Non-Medical): No  Physical Activity: Sufficiently Active (06/14/2020)   Exercise Vital Sign    Days of Exercise per Week: 3 days    Minutes of Exercise per Session: 60 min  Stress: No Stress Concern Present (06/14/2020)   [employer] of Occupational Health - Occupational Stress Questionnaire    Feeling of Stress : Not at all  Social Connections: Socially Integrated (06/14/2020)   Social Connection and Isolation Panel [NHANES]    Frequency of Communication with Friends and Family: More than three times a week    Frequency of Social Gatherings with Friends and Family: More than three times a week    Attends Religious Services: More than 4 times per year    Active Member of [employer]  or Organizations: No    Attends Banker Meetings: More than 4 times per year    Marital Status: Married  Catering manager Violence: Not At Risk (06/14/2020)   Humiliation, Afraid, Rape, and Kick questionnaire    Fear of Current or Ex-Partner: No    Emotionally Abused: No    Physically Abused: No    Sexually Abused: No    Outpatient Medications Prior to Visit  Medication Sig Dispense Refill   clotrimazole-betamethasone (LOTRISONE) cream Apply 1 Application topically daily. (Patient not taking: Reported on 03/30/2022) 30 g 0   ipratropium (ATROVENT) 0.03 % nasal spray Place 2 sprays into both nostrils 2 (two) times daily as needed for rhinitis. 30 mL 0   loratadine (CLARITIN) 10 MG tablet Take 10 mg by mouth  daily. As needed     metFORMIN (GLUCOPHAGE-XR) 500 MG 24 hr tablet Take 1 tablet (500 mg total) by mouth daily with breakfast. 90 tablet 3   rosuvastatin (CRESTOR) 10 MG tablet Take 1 tablet (10 mg total) by mouth daily. 90 tablet 3   telmisartan-hydrochlorothiazide (MICARDIS HCT) 80-12.5 MG tablet Take 1 tablet by mouth daily. 90 tablet 1   pantoprazole (PROTONIX) 40 MG tablet Take 1 tablet by mouth once daily 30 tablet 0   No facility-administered medications prior to visit.    No Known Allergies  ROS Review of Systems  Constitutional:  Positive for fatigue. Negative for chills and fever.  HENT:  Negative for congestion, sinus pressure, sinus pain and sore throat.   Eyes:  Negative for pain and discharge.  Respiratory:  Negative for cough and shortness of breath.   Cardiovascular:  Negative for chest pain and palpitations.  Gastrointestinal:  Positive for abdominal pain and nausea. Negative for constipation, diarrhea and vomiting.  Endocrine: Negative for polydipsia and polyuria.  Genitourinary:  Negative for dysuria, hematuria, vaginal discharge and vaginal pain.  Musculoskeletal:  Negative for neck pain and neck stiffness.  Skin:  Negative for rash.  Neurological:  Negative for dizziness and weakness.  Psychiatric/Behavioral:  Negative for agitation and behavioral problems.       Objective:    Physical Exam Vitals reviewed.  Constitutional:      General: She is not in acute distress.    Appearance: She is obese. She is not diaphoretic.  HENT:     Head: Normocephalic and atraumatic.     Nose: Nose normal.     Mouth/Throat:     Mouth: Mucous membranes are moist.  Eyes:     General: No scleral icterus.    Extraocular Movements: Extraocular movements intact.  Cardiovascular:     Rate and Rhythm: Normal rate and regular rhythm.     Heart sounds: Normal heart sounds. No murmur heard. Pulmonary:     Breath sounds: Normal breath sounds. No wheezing or rales.   Musculoskeletal:     Cervical back: Neck supple. No tenderness.     Right lower leg: No edema.     Left lower leg: No edema.  Skin:    General: Skin is warm.     Findings: No rash.  Neurological:     General: No focal deficit present.     Mental Status: She is alert and oriented to person, place, and time.     Sensory: No sensory deficit.     Motor: No weakness.  Psychiatric:        Mood and Affect: Mood normal.        Behavior: Behavior normal.  BP 132/78 (BP Location: Right Arm, Patient Position: Sitting, Cuff Size: Normal)   Pulse (!) 110   Ht 5\' 2"  (1.575 m)   Wt 196 lb 9.6 oz (89.2 kg)   SpO2 97%   BMI 35.96 kg/m  Wt Readings from Last 3 Encounters:  08/28/22 196 lb 9.6 oz (89.2 kg)  05/21/22 197 lb 3.2 oz (89.4 kg)  02/17/22 200 lb 3.2 oz (90.8 kg)    Lab Results  Component Value Date   TSH 1.500 02/13/2022   Lab Results  Component Value Date   WBC 5.2 02/13/2022   HGB 12.8 02/13/2022   HCT 36.5 02/13/2022   MCV 96 02/13/2022   PLT 385 02/13/2022   Lab Results  Component Value Date   NA 135 05/15/2022   K 3.7 05/15/2022   CO2 23 05/15/2022   GLUCOSE 91 05/15/2022   BUN 13 05/15/2022   CREATININE 0.92 05/15/2022   BILITOT 0.6 05/15/2022   ALKPHOS 61 05/15/2022   AST 31 05/15/2022   ALT 25 05/15/2022   PROT 7.7 05/15/2022   ALBUMIN 5.1 (H) 05/15/2022   CALCIUM 9.6 05/15/2022   EGFR 81 05/15/2022   Lab Results  Component Value Date   CHOL 121 05/15/2022   Lab Results  Component Value Date   HDL 40 05/15/2022   Lab Results  Component Value Date   LDLCALC 21 05/15/2022   Lab Results  Component Value Date   TRIG 435 (H) 05/15/2022   Lab Results  Component Value Date   CHOLHDL 3.0 05/15/2022   Lab Results  Component Value Date   HGBA1C 6.4 (H) 05/15/2022      Assessment & Plan:   Problem List Items Addressed This Visit       Digestive   Gastroesophageal reflux disease    Uncontrolled with Pepcid Had switched to  Protonix Can take Pepcid in the evening as needed      Relevant Medications   pantoprazole (PROTONIX) 40 MG tablet   Alcohol-induced acute pancreatitis - Primary    Has had recurrent acute pancreatitis due to alcohol use Needs to maintain adequate hydration for now Advance diet as tolerated Needs to strictly avoid alcohol use      Relevant Medications   pantoprazole (PROTONIX) 40 MG tablet     Other   Alcohol dependence (HCC)    Used to take 7-8 drinks of vodka every day, has been cutting down H/o recurrent pancreatitis, f/u CMP Advised to quit alcohol use - support group information provided      Encounter for examination following treatment at hospital    ER chart reviewed including imaging Acute pancreatitis, symptoms improved with hydration and Phenergan      Renal mass    Recent CT abdomen showed exophytic renal mass - 1.8 cm in size Previous CT abdomen in 07/23 had reported renal cyst, which was described benign Check MRI abdomen as recommended follow up for renal mass      Relevant Orders   MR Abdomen W Wo Contrast    Meds ordered this encounter  Medications   pantoprazole (PROTONIX) 40 MG tablet    Sig: Take 1 tablet (40 mg total) by mouth daily.    Dispense:  30 tablet    Refill:  5    Follow-up: Return if symptoms worsen or fail to improve.    Anabel Halon, MD

## 2022-09-16 ENCOUNTER — Ambulatory Visit: Payer: 59 | Admitting: Internal Medicine

## 2022-09-24 ENCOUNTER — Ambulatory Visit (HOSPITAL_COMMUNITY)
Admission: RE | Admit: 2022-09-24 | Discharge: 2022-09-24 | Disposition: A | Discharge status: Home / Self Care | Payer: 59 | Source: Ambulatory Visit | Attending: Internal Medicine | Admitting: Internal Medicine

## 2022-09-24 DIAGNOSIS — N2889 Other specified disorders of kidney and ureter: Secondary | ICD-10-CM | POA: Diagnosis not present

## 2022-09-24 MED ORDER — GADOBUTROL 1 MMOL/ML IV SOLN
10.0000 mL | Freq: Once | INTRAVENOUS | Status: AC | PRN
  Administered 2022-09-24: 10 mL via INTRAVENOUS

## 2022-09-30 ENCOUNTER — Ambulatory Visit (INDEPENDENT_AMBULATORY_CARE_PROVIDER_SITE_OTHER): Payer: 59 | Admitting: Internal Medicine

## 2022-09-30 ENCOUNTER — Encounter: Payer: Self-pay | Admitting: Internal Medicine

## 2022-09-30 VITALS — BP 130/82 | HR 115 | Ht 62.0 in | Wt 200.2 lb

## 2022-09-30 DIAGNOSIS — E1169 Type 2 diabetes mellitus with other specified complication: Secondary | ICD-10-CM | POA: Diagnosis not present

## 2022-09-30 DIAGNOSIS — Z720 Tobacco use: Secondary | ICD-10-CM

## 2022-09-30 DIAGNOSIS — E782 Mixed hyperlipidemia: Secondary | ICD-10-CM

## 2022-09-30 DIAGNOSIS — I10 Essential (primary) hypertension: Secondary | ICD-10-CM | POA: Diagnosis not present

## 2022-09-30 DIAGNOSIS — K860 Alcohol-induced chronic pancreatitis: Secondary | ICD-10-CM | POA: Diagnosis not present

## 2022-09-30 DIAGNOSIS — Z114 Encounter for screening for human immunodeficiency virus [HIV]: Secondary | ICD-10-CM

## 2022-09-30 DIAGNOSIS — Z1159 Encounter for screening for other viral diseases: Secondary | ICD-10-CM

## 2022-09-30 DIAGNOSIS — N2889 Other specified disorders of kidney and ureter: Secondary | ICD-10-CM | POA: Diagnosis not present

## 2022-09-30 DIAGNOSIS — Z7984 Long term (current) use of oral hypoglycemic drugs: Secondary | ICD-10-CM

## 2022-09-30 LAB — POCT GLYCOSYLATED HEMOGLOBIN (HGB A1C): HbA1c, POC (controlled diabetic range): 7 % (ref 0.0–7.0)

## 2022-09-30 MED ORDER — BLOOD GLUCOSE MONITOR KIT
PACK | 0 refills | Status: DC
Start: 2022-09-30 — End: 2024-02-23

## 2022-09-30 NOTE — Progress Notes (Signed)
Established Patient Office Visit  Subjective:  Patient ID: Tracy Ortiz, female    DOB: 02/14/1981  Age: 42 y.o. MRN: [id number]  CC:  Chief Complaint  Patient presents with   Hypertension    Four month follow up    Diabetes    Four month follow up     HPI Tracy Ortiz is a 42 y.o. female with past medical history of hypertension, sarcoidosis, hyperlipidemia, alcohol and tobacco abuse who presents for f/u of her chronic medical conditions.  HTN: Her BP is wnl today, is better compared to prior.  She has been taking medications regularly now.  She denies any headache, dizziness, chest pain, dyspnea.  She has intermittent palpitations, likely due to alcohol withdrawal.  Type II DM: Her HbA1c is 7.0 now. She is taking Metformin now. She has chronic fatigue.  She also has history of alcohol abuse and is trying to cut down slowly.  She admits that she needs to cut down sweet products, especially candies.  She has family history of type II DM-both her parents and her siblings.  She denies polyuria or polyphagia currently.  Recurrent pancreatitis: She has had multiple episodes of pancreatitis, likely due to alcohol dependence.  She used to take 2-3 drinks of vodka a day, but has cut down and does not take it daily now, She states that she takes it for stress/anxiety.  She acknowledges that she needs to cut down.  She currently denies any abdominal pain, nausea or vomiting.  Her last drink was in the last week.  She has been advised to explore local support groups and is willing to join them.  She has had GI evaluation in the past, but did not follow-up after initial consultation.   Past Medical History:  Diagnosis Date   HPV (human papilloma virus) infection    Hyperlipidemia    Hypertension    Pancreatitis     History reviewed. No pertinent surgical history.  Family History  Problem Relation Age of Onset   Other Father        healthy   Diabetes Mother    Polycystic ovary  syndrome Sister     Social History   Socioeconomic History   Marital status: Married    Spouse name: Not on file   Number of children: Not on file   Years of education: Not on file   Highest education level: Not on file  Occupational History   Not on file  Tobacco Use   Smoking status: Every Day   Smokeless tobacco: Never  Substance and Sexual Activity   Alcohol use: Yes    Comment: wine daily   Drug use: Never   Sexual activity: Yes    Birth control/protection: Condom  Other Topics Concern   Not on file  Social History Narrative   Not on file   Social Determinants of Health   Financial Resource Strain: Low Risk  (06/14/2020)   Overall Financial Resource Strain (CARDIA)    Difficulty of Paying Living Expenses: Not hard at all  Food Insecurity: No Food Insecurity (06/14/2020)   Hunger Vital Sign    Worried About Running Out of Food in the Last Year: Never true    Ran Out of Food in the Last Year: Never true  Transportation Needs: No Transportation Needs (06/14/2020)   PRAPARE - Administrator, Civil Service (Medical): No    Lack of Transportation (Non-Medical): No  Physical Activity: Sufficiently Active (06/14/2020)   Exercise Vital Sign  Days of Exercise per Week: 3 days    Minutes of Exercise per Session: 60 min  Stress: No Stress Concern Present (06/14/2020)   [employer] of Occupational Health - Occupational Stress Questionnaire    Feeling of Stress : Not at all  Social Connections: Socially Integrated (06/14/2020)   Social Connection and Isolation Panel [NHANES]    Frequency of Communication with Friends and Family: More than three times a week    Frequency of Social Gatherings with Friends and Family: More than three times a week    Attends Religious Services: More than 4 times per year    Active Member of [employer] or Organizations: No    Attends Engineer, structural: More than 4 times per year    Marital Status: Married  Catering manager  Violence: Not At Risk (06/14/2020)   Humiliation, Afraid, Rape, and Kick questionnaire    Fear of Current or Ex-Partner: No    Emotionally Abused: No    Physically Abused: No    Sexually Abused: No    Outpatient Medications Prior to Visit  Medication Sig Dispense Refill   ipratropium (ATROVENT) 0.03 % nasal spray Place 2 sprays into both nostrils 2 (two) times daily as needed for rhinitis. 30 mL 0   loratadine (CLARITIN) 10 MG tablet Take 10 mg by mouth daily. As needed     metFORMIN (GLUCOPHAGE-XR) 500 MG 24 hr tablet Take 1 tablet (500 mg total) by mouth daily with breakfast. 90 tablet 3   pantoprazole (PROTONIX) 40 MG tablet Take 1 tablet (40 mg total) by mouth daily. 30 tablet 5   rosuvastatin (CRESTOR) 10 MG tablet Take 1 tablet (10 mg total) by mouth daily. 90 tablet 3   telmisartan-hydrochlorothiazide (MICARDIS HCT) 80-12.5 MG tablet Take 1 tablet by mouth daily. 90 tablet 1   clotrimazole-betamethasone (LOTRISONE) cream Apply 1 Application topically daily. (Patient not taking: Reported on 03/30/2022) 30 g 0   No facility-administered medications prior to visit.    No Known Allergies  ROS Review of Systems  Constitutional:  Positive for fatigue. Negative for chills and fever.  HENT:  Negative for congestion, sinus pressure, sinus pain and sore throat.   Eyes:  Negative for pain and discharge.  Respiratory:  Negative for cough and shortness of breath.   Cardiovascular:  Positive for palpitations. Negative for chest pain.  Gastrointestinal:  Negative for abdominal pain, constipation, diarrhea, nausea and vomiting.  Endocrine: Negative for polydipsia and polyuria.  Genitourinary:  Negative for dysuria, hematuria, vaginal discharge and vaginal pain.  Musculoskeletal:  Negative for neck pain and neck stiffness.  Skin:  Negative for rash.  Neurological:  Negative for dizziness and weakness.  Psychiatric/Behavioral:  Negative for agitation and behavioral problems.        Objective:    Physical Exam Vitals reviewed.  Constitutional:      General: She is not in acute distress.    Appearance: She is obese. She is not diaphoretic.  HENT:     Head: Normocephalic and atraumatic.     Nose: Nose normal.     Mouth/Throat:     Mouth: Mucous membranes are moist.  Eyes:     General: No scleral icterus.    Extraocular Movements: Extraocular movements intact.  Cardiovascular:     Rate and Rhythm: Normal rate and regular rhythm.     Pulses: Normal pulses.     Heart sounds: Normal heart sounds. No murmur heard. Pulmonary:     Breath sounds: Normal breath sounds. No  wheezing or rales.  Musculoskeletal:     Cervical back: Neck supple. No tenderness.     Right lower leg: No edema.     Left lower leg: No edema.  Skin:    General: Skin is warm.     Findings: No rash.  Neurological:     General: No focal deficit present.     Mental Status: She is alert and oriented to person, place, and time.     Sensory: No sensory deficit.     Motor: No weakness.  Psychiatric:        Mood and Affect: Mood normal.        Behavior: Behavior normal.     BP 130/82 (BP Location: Right Arm, Patient Position: Sitting, Cuff Size: Normal)   Pulse (!) 115   Ht 5\' 2"  (1.575 m)   Wt 200 lb 3.2 oz (90.8 kg)   SpO2 97%   BMI 36.62 kg/m  Wt Readings from Last 3 Encounters:  09/30/22 200 lb 3.2 oz (90.8 kg)  08/28/22 196 lb 9.6 oz (89.2 kg)  05/21/22 197 lb 3.2 oz (89.4 kg)    Lab Results  Component Value Date   TSH 1.500 02/13/2022   Lab Results  Component Value Date   WBC 5.2 02/13/2022   HGB 12.8 02/13/2022   HCT 36.5 02/13/2022   MCV 96 02/13/2022   PLT 385 02/13/2022   Lab Results  Component Value Date   NA 135 05/15/2022   K 3.7 05/15/2022   CO2 23 05/15/2022   GLUCOSE 91 05/15/2022   BUN 13 05/15/2022   CREATININE 0.92 05/15/2022   BILITOT 0.6 05/15/2022   ALKPHOS 61 05/15/2022   AST 31 05/15/2022   ALT 25 05/15/2022   PROT 7.7 05/15/2022   ALBUMIN  5.1 (H) 05/15/2022   CALCIUM 9.6 05/15/2022   EGFR 81 05/15/2022   Lab Results  Component Value Date   CHOL 121 05/15/2022   Lab Results  Component Value Date   HDL 40 05/15/2022   Lab Results  Component Value Date   LDLCALC 21 05/15/2022   Lab Results  Component Value Date   TRIG 435 (H) 05/15/2022   Lab Results  Component Value Date   CHOLHDL 3.0 05/15/2022   Lab Results  Component Value Date   HGBA1C 7.0 09/30/2022      Assessment & Plan:   Problem List Items Addressed This Visit       Cardiovascular and Mediastinum   Primary hypertension - Primary    BP Readings from Last 1 Encounters:  09/30/22 130/82  Well-controlled with  telmisartan-HCTZ 80-12.5 mg daily Likely due to alcohol dependence, needs to cut down Counseled for compliance with the medications Advised DASH diet and moderate exercise/walking, at least 150 mins/week      Relevant Orders   TSH   CMP14+EGFR   CBC with Differential/Platelet     Digestive   Chronic pancreatitis (HCC)    Related to alcohol use and/or hypertriglyceridemia Has had ER visits for pancreatitis, advised to increase fluid intake and avoid alcohol use Needs to follow-up with GI for possible need for EUS Soft diet, advance as tolerated Checked CMP and lipid profile      Relevant Orders   CMP14+EGFR     Endocrine   Type 2 diabetes mellitus with other specified complication (HCC)    Lab Results  Component Value Date   HGBA1C 7.0 09/30/2022  Associated with HTN and HLD Well-controlled, but worse recently due to noncompliance to  diet On Metformin 500 mg QD Advised to follow diabetic diet On statin and ARB F/u CMP and lipid panel Diabetic eye exam: Advised to follow up with Ophthalmology for diabetic eye exam      Relevant Medications   blood glucose meter kit and supplies KIT   Other Relevant Orders   Ambulatory referral to Optometry   Hemoglobin A1c   CMP14+EGFR   POCT glycosylated hemoglobin (Hb A1C)  (Completed)     Other   Hyperlipidemia    Check lipid profile On Crestor -compliance is questionable Advised to take omega-3 Was not able to get Vascepa due to insurance coverage concern      Tobacco abuse    Smokes 1 pack/week  Asked about quitting: confirms that she currently smokes cigarettes Advise to quit smoking: Educated about QUITTING to reduce the risk of cancer, cardio and cerebrovascular disease. Assess willingness: Unwilling to quit at this time, but is working on cutting back. Assist with counseling and pharmacotherapy: Counseled for 5 minutes and literature provided. Arrange for follow up: If not quitting follow up in 3 months and continue to offer help.      Relevant Orders   Lipid panel   Renal mass    Recent CT abdomen showed exophytic renal mass - 1.8 cm in size Previous CT abdomen in 07/23 had reported renal cyst, which was described benign Checked MRI abdomen as recommended follow up for renal mass - benign renal cyst      Other Visit Diagnoses     Need for hepatitis C screening test       Relevant Orders   Hepatitis C Antibody   Encounter for screening for HIV       Relevant Orders   HIV antibody (with reflex)         Meds ordered this encounter  Medications   blood glucose meter kit and supplies KIT    Sig: Dispense based on patient and insurance preference. Use up to four times daily as directed.    Dispense:  1 each    Refill:  0    Order Specific Question:   Number of strips    Answer:   100    Order Specific Question:   Number of lancets    Answer:   100    Follow-up: Return in about 4 months (around 01/30/2023) for Annual physical.    Anabel Halon, MD

## 2022-09-30 NOTE — Assessment & Plan Note (Signed)
Related to alcohol use and/or hypertriglyceridemia Has had ER visits for pancreatitis, advised to increase fluid intake and avoid alcohol use Needs to follow-up with GI for possible need for EUS Soft diet, advance as tolerated Checked CMP and lipid profile 

## 2022-09-30 NOTE — Assessment & Plan Note (Addendum)
Lab Results  Component Value Date   HGBA1C 7.0 09/30/2022   Associated with HTN and HLD Well-controlled, but worse recently due to noncompliance to diet On Metformin 500 mg QD Advised to follow diabetic diet On statin and ARB F/u CMP and lipid panel Diabetic eye exam: Advised to follow up with Ophthalmology for diabetic eye exam

## 2022-09-30 NOTE — Assessment & Plan Note (Signed)
Recent CT abdomen showed exophytic renal mass - 1.8 cm in size Previous CT abdomen in 07/23 had reported renal cyst, which was described benign Checked MRI abdomen as recommended follow up for renal mass - benign renal cyst

## 2022-09-30 NOTE — Assessment & Plan Note (Addendum)
BP Readings from Last 1 Encounters:  09/30/22 130/82   Well-controlled with  telmisartan-HCTZ 80-12.5 mg daily Likely due to alcohol dependence, needs to cut down Counseled for compliance with the medications Advised DASH diet and moderate exercise/walking, at least 150 mins/week

## 2022-09-30 NOTE — Patient Instructions (Addendum)
Please continue to take medications as prescribed.  Please continue to follow low carb diet and perform moderate exercise/walking at least 150 mins/week.  Please get fasting blood tests done before the next visit.  Please contact Optometry office for eye exam:  [facility] [address] [place], [place], [place] [address] [phone]

## 2022-10-02 NOTE — Assessment & Plan Note (Signed)
Check lipid profile On Crestor -compliance is questionable Advised to take omega-3 Was not able to get Vascepa due to insurance coverage concern

## 2022-10-02 NOTE — Assessment & Plan Note (Signed)
Smokes 1 pack/week  Asked about quitting: confirms that she currently smokes cigarettes Advise to quit smoking: Educated about QUITTING to reduce the risk of cancer, cardio and cerebrovascular disease. Assess willingness: Unwilling to quit at this time, but is working on cutting back. Assist with counseling and pharmacotherapy: Counseled for 5 minutes and literature provided. Arrange for follow up: If not quitting follow up in 3 months and continue to offer help.

## 2022-10-26 ENCOUNTER — Telehealth: Payer: Self-pay | Admitting: Internal Medicine

## 2022-10-26 NOTE — Telephone Encounter (Signed)
Disability forms  Copied Noted Sleeved  Fax back # [phone] [employer] and [facility]

## 2022-10-27 DIAGNOSIS — Z0279 Encounter for issue of other medical certificate: Secondary | ICD-10-CM

## 2022-11-02 NOTE — Telephone Encounter (Signed)
Patient called asked if we would give her a call once we have faxed these forms so she can pick up a copy.

## 2022-11-16 NOTE — Telephone Encounter (Signed)
Forms faxed with confirmation. Charge fee applied.

## 2022-12-07 ENCOUNTER — Ambulatory Visit (INDEPENDENT_AMBULATORY_CARE_PROVIDER_SITE_OTHER): Payer: 59

## 2022-12-07 DIAGNOSIS — E1169 Type 2 diabetes mellitus with other specified complication: Secondary | ICD-10-CM

## 2022-12-07 NOTE — Progress Notes (Signed)
Tracy Ortiz arrived 12/07/2022 and has given verbal consent to obtain images and complete their overdue diabetic retinal screening.  The images have been sent to an ophthalmologist or optometrist for review and interpretation.  Results will be sent back to Anabel Halon, MD for review.  Patient has been informed they will be contacted when we receive the results via telephone or MyChart

## 2022-12-30 IMAGING — US US ABDOMEN LIMITED
1 series · 14 of 25 positions shown · non-contrast
Comparison: None.

CLINICAL DATA: Recurrent pancreatitis.

EXAM:
ULTRASOUND ABDOMEN LIMITED RIGHT UPPER QUADRANT

[Series 1: us abdomen limited ruq (liver/gb) · 14 of 52 slices shown]
[im 1/52]
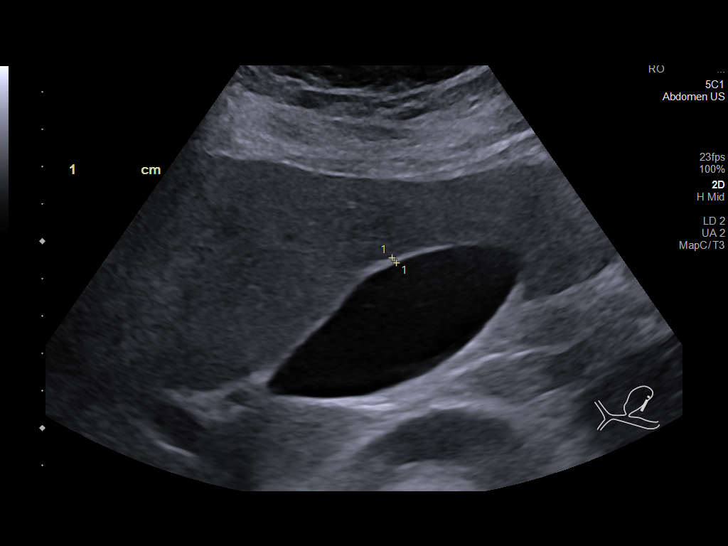
[im 5/52]
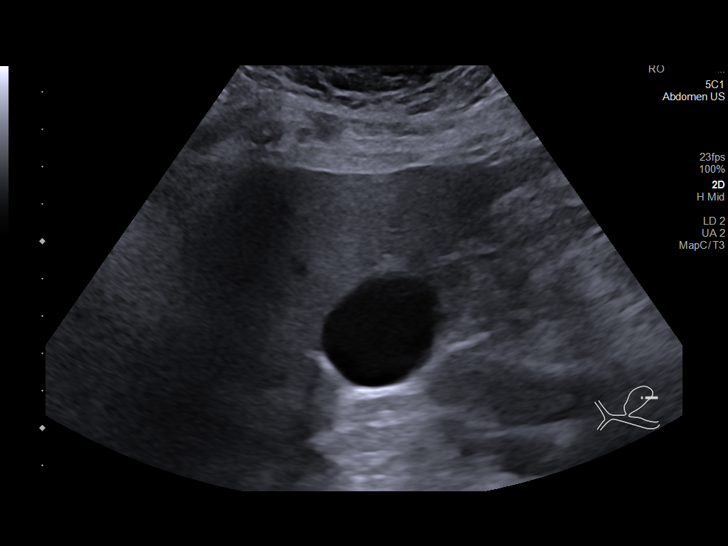
[im 9/52]
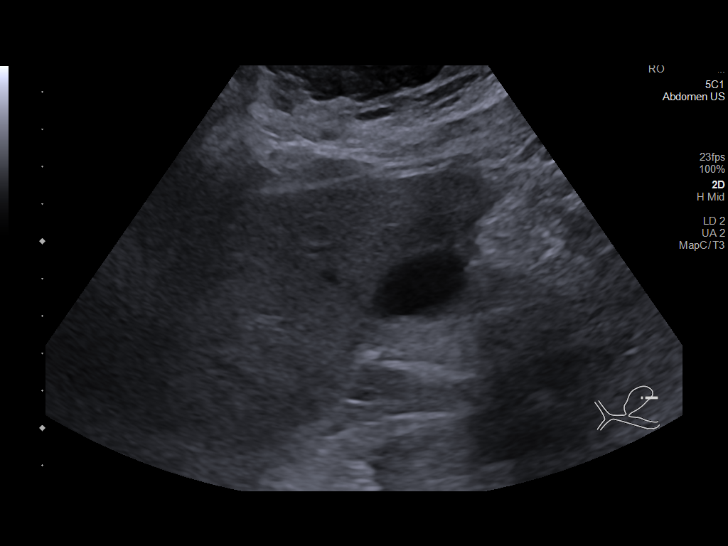
[im 13/52]
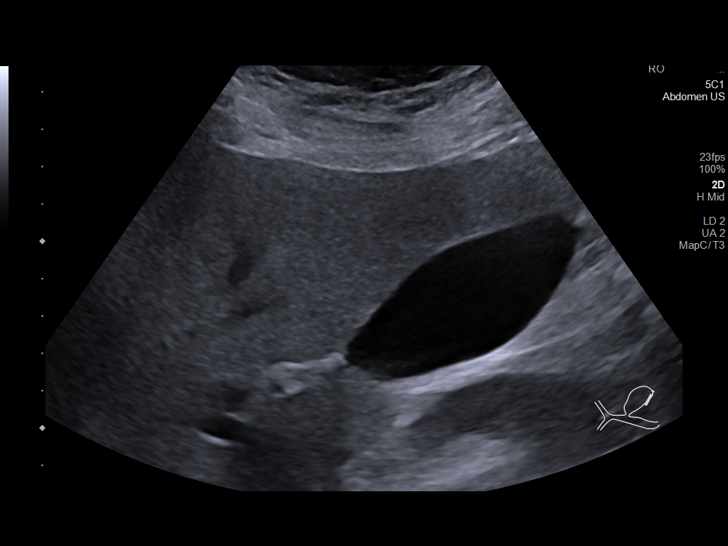
[im 18/52]
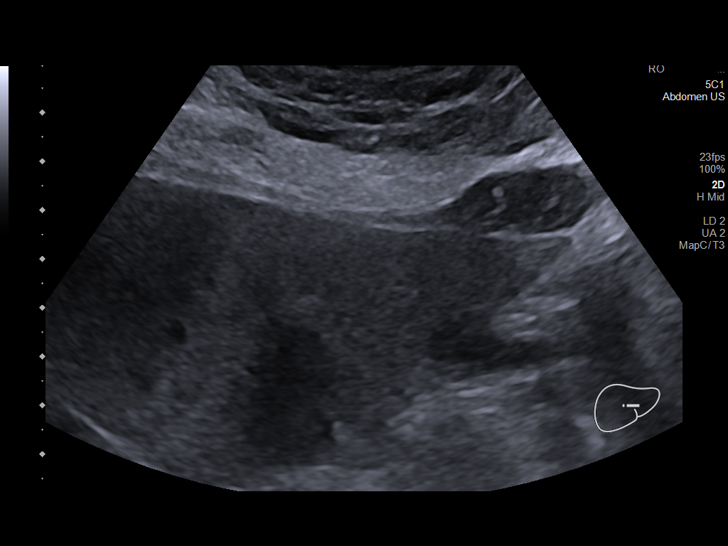
[im 20/52]
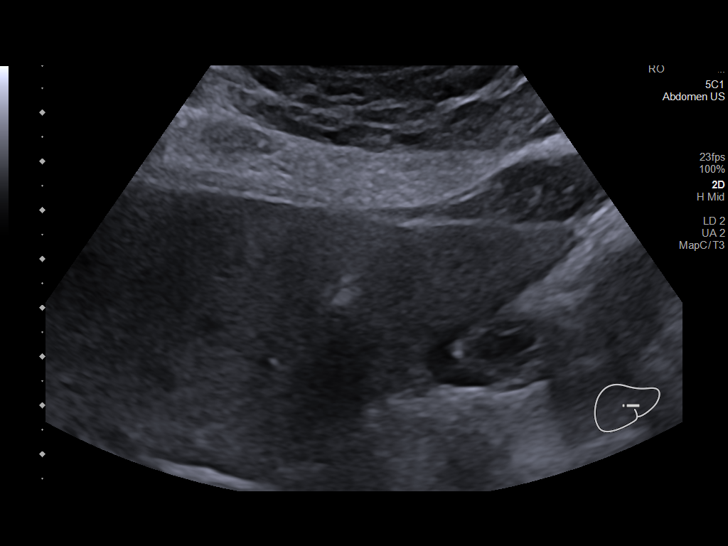
[im 24/52]
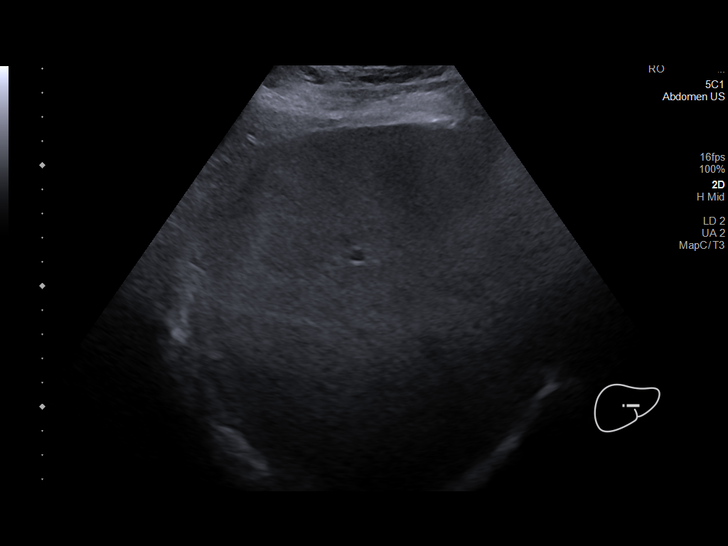
[im 28/52]
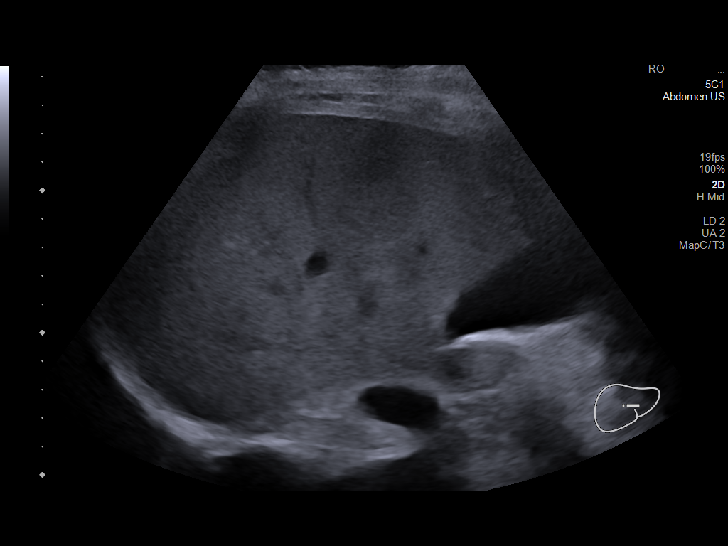
[im 32/52]
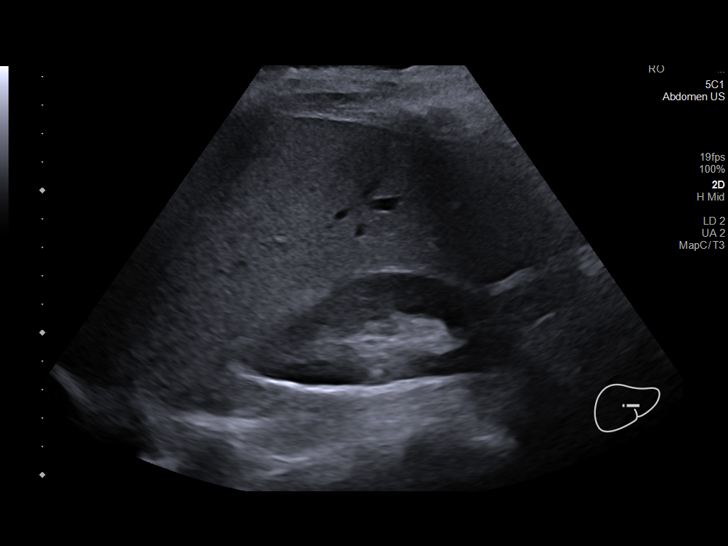
[im 35/52]
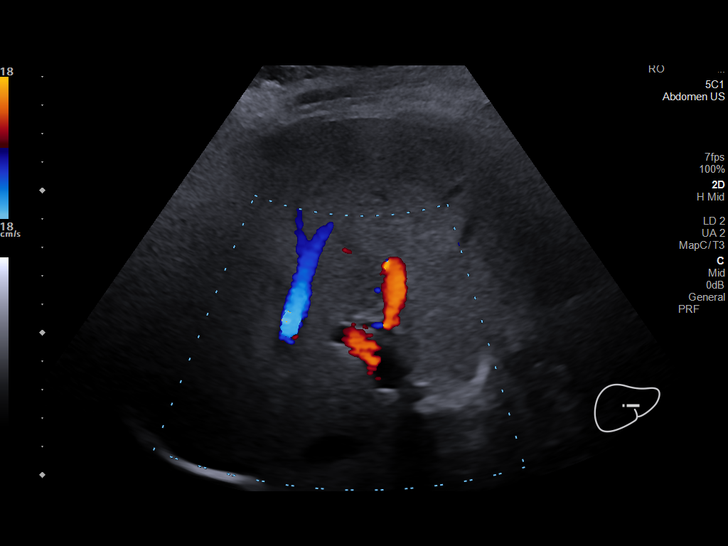
[im 39/52]
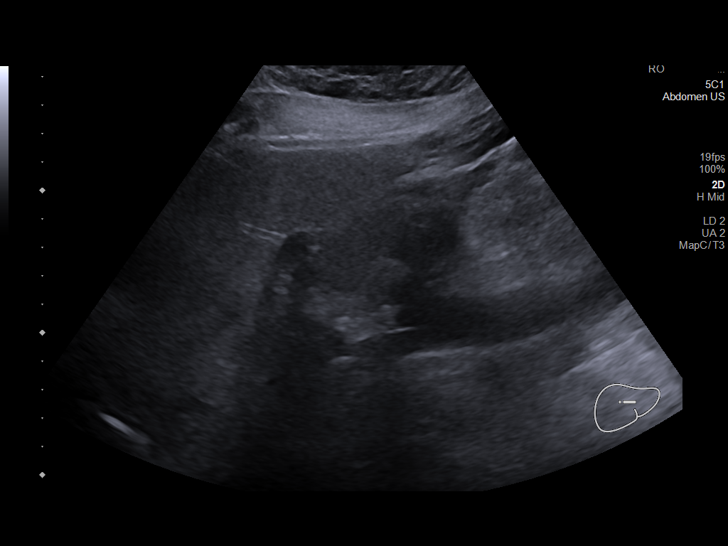
[im 43/52]
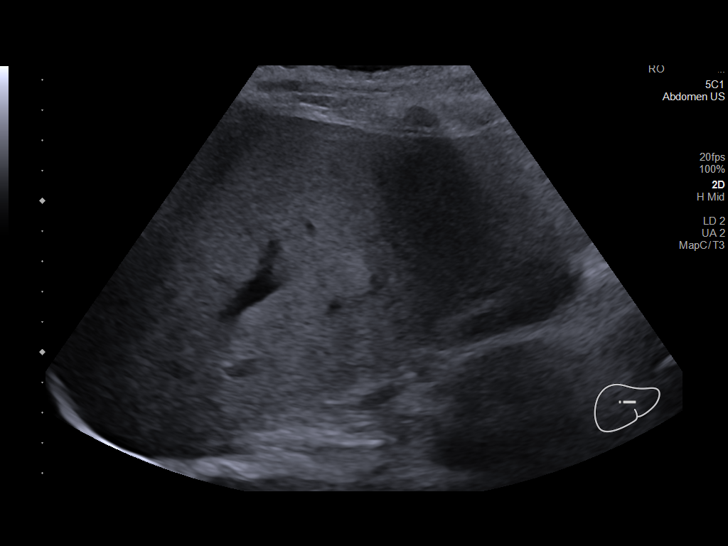
[im 47/52]
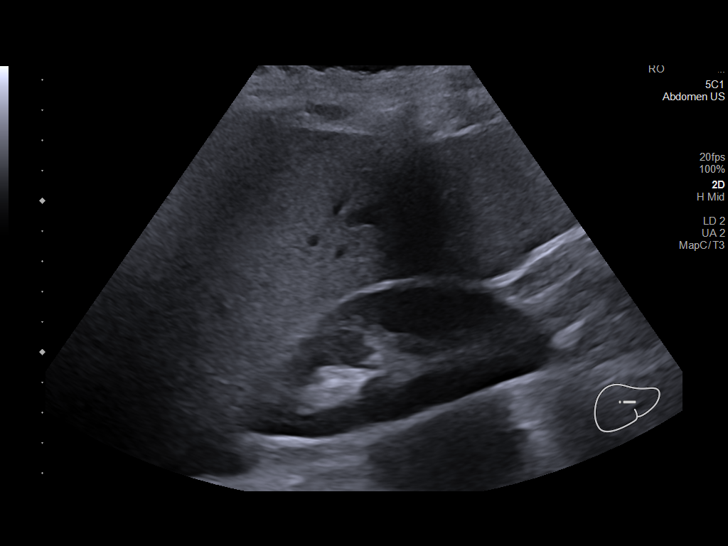
[im 52/52]
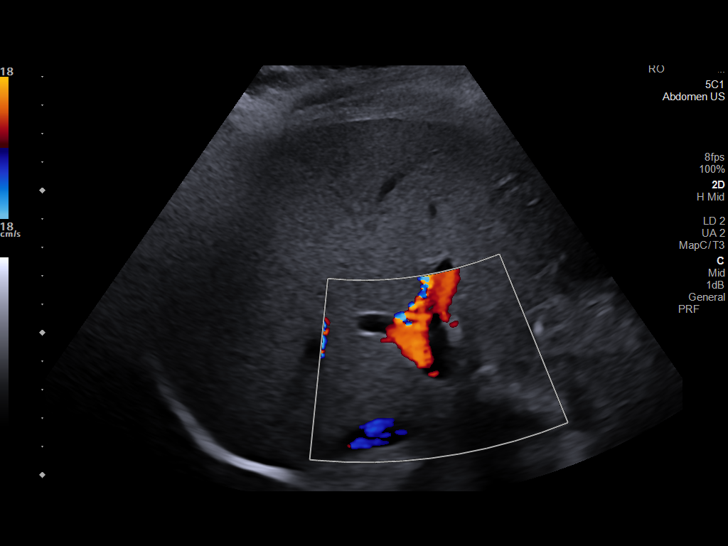

[14 of 25 positions shown; findings below may reference images not displayed]

FINDINGS: Gallbladder:

No gallstones or wall thickening visualized. No sonographic Murphy
sign noted by sonographer.

Common bile duct:

Diameter: 4.7 mm

Liver:

Diffuse increased echogenicity throughout the liver. No focal mass.
Portal vein is patent on color Doppler imaging with normal direction
of blood flow towards the liver.

Other: None.
IMPRESSION: 1. Diffuse increased echogenicity throughout the liver is
nonspecific but often due to hepatic steatosis. No other
abnormalities.

## 2023-01-08 ENCOUNTER — Telehealth: Payer: Self-pay | Admitting: Internal Medicine

## 2023-01-08 NOTE — Telephone Encounter (Signed)
Spoke to patient, have not received forms

## 2023-01-08 NOTE — Telephone Encounter (Signed)
Patient calling wanting to know if the forms to be out of work were received by Dr Allena Katz- requesting a call when forms are complete

## 2023-02-01 ENCOUNTER — Ambulatory Visit: Payer: 59 | Admitting: Internal Medicine

## 2023-02-01 ENCOUNTER — Encounter: Payer: Self-pay | Admitting: Internal Medicine

## 2023-02-01 VITALS — BP 126/83 | HR 108 | Ht 62.0 in | Wt 196.6 lb

## 2023-02-01 DIAGNOSIS — F10288 Alcohol dependence with other alcohol-induced disorder: Secondary | ICD-10-CM

## 2023-02-01 DIAGNOSIS — Z0001 Encounter for general adult medical examination with abnormal findings: Secondary | ICD-10-CM

## 2023-02-01 DIAGNOSIS — K219 Gastro-esophageal reflux disease without esophagitis: Secondary | ICD-10-CM

## 2023-02-01 DIAGNOSIS — I10 Essential (primary) hypertension: Secondary | ICD-10-CM

## 2023-02-01 DIAGNOSIS — E785 Hyperlipidemia, unspecified: Secondary | ICD-10-CM

## 2023-02-01 DIAGNOSIS — Z720 Tobacco use: Secondary | ICD-10-CM

## 2023-02-01 DIAGNOSIS — K860 Alcohol-induced chronic pancreatitis: Secondary | ICD-10-CM

## 2023-02-01 DIAGNOSIS — E782 Mixed hyperlipidemia: Secondary | ICD-10-CM

## 2023-02-01 DIAGNOSIS — E1169 Type 2 diabetes mellitus with other specified complication: Secondary | ICD-10-CM

## 2023-02-01 MED ORDER — PANTOPRAZOLE SODIUM 40 MG PO TBEC: 40.0000 mg | DELAYED_RELEASE_TABLET | Freq: Every day | ORAL | 5 refills | Status: DC

## 2023-02-01 MED ORDER — TELMISARTAN-HCTZ 80-12.5 MG PO TABS: 1.0000 | ORAL_TABLET | Freq: Every day | ORAL | 1 refills | Status: DC

## 2023-02-01 MED ORDER — ROSUVASTATIN CALCIUM 10 MG PO TABS: 10.0000 mg | ORAL_TABLET | Freq: Every day | ORAL | 3 refills | Status: DC

## 2023-02-01 NOTE — Assessment & Plan Note (Signed)
Smokes 1 pack/week  Asked about quitting: confirms that she currently smokes cigarettes Advise to quit smoking: Educated about QUITTING to reduce the risk of cancer, cardio and cerebrovascular disease. Assess willingness: Unwilling to quit at this time, but is working on cutting back. Assist with counseling and pharmacotherapy: Counseled for 5 minutes and literature provided. Arrange for follow up: If not quitting follow up in 3 months and continue to offer help.

## 2023-02-01 NOTE — Assessment & Plan Note (Signed)
Well-controlled with Protonix Can take Pepcid in the evening as needed

## 2023-02-01 NOTE — Assessment & Plan Note (Addendum)
Used to take 3-5 drinks of vodka every day, has been cutting down H/o recurrent pancreatitis, f/u CMP Advised to quit alcohol use - support group information provided

## 2023-02-01 NOTE — Assessment & Plan Note (Signed)
Check lipid profile On Crestor -compliance is questionable Advised to take omega-3 Was not able to get Vascepa due to insurance coverage concern

## 2023-02-01 NOTE — Assessment & Plan Note (Addendum)
BP Readings from Last 1 Encounters:  02/01/23 126/83   Well-controlled with  telmisartan-HCTZ 80-12.5 mg daily HTN also likely due to alcohol dependence, needs to cut down Counseled for compliance with the medications Advised DASH diet and moderate exercise/walking, at least 150 mins/week

## 2023-02-01 NOTE — Assessment & Plan Note (Addendum)
Lab Results  Component Value Date   HGBA1C 7.0 09/30/2022   Associated with HTN and HLD Well-controlled, but worse recently due to noncompliance to diet and alcohol use On Metformin 500 mg QD Advised to follow diabetic diet On statin and ARB F/u CMP and lipid panel Diabetic eye exam: Advised to follow up with Ophthalmology for diabetic eye exam

## 2023-02-01 NOTE — Progress Notes (Signed)
Established Patient Office Visit  Subjective:  Patient ID: Tracy Ortiz, female    DOB: 05/10/1980  Age: 42 y.o. MRN: [id number]  CC:  Chief Complaint  Patient presents with   Annual Exam    HPI Tracy Ortiz is a 41 y.o. female with past medical history of hypertension, sarcoidosis, hyperlipidemia, alcohol and tobacco abuse who presents for annual physical.  HTN: Her BP is wnl today.  She has been taking telmisartan-HCTZ  80-12.5 mg once daily. She denies any headache, dizziness, chest pain, dyspnea or palpitations currently.  Type II DM: Her HbA1c was 7.0 in 06/24.  She has stopped started taking metformin 500 mg QD, but compliance is questionable.  She has chronic fatigue, but denies any polyuria or polyphagia.  Her alcohol intake has been worsening her glycemic profile, she understands that, but still takes it  almost daily.  HLD: She takes Crestor, but could not take Vascepa as her insurance did not cover it.  Recurrent pancreatitis: She has had multiple episodes of pancreatitis, likely due to alcohol dependence.  She currently takes 3-5 drinks of vodka a day and states that she takes it as she sees her husband drinking it daily.  She acknowledges that she needs to cut down.  She currently denies any abdominal pain, nausea or vomiting.  Her last drink was 2 days ago.  After discussion, she has agreed to explore local support groups, but does not join it.  She has had GI evaluation in the past, but did not follow-up after initial consultation.      Past Medical History:  Diagnosis Date   HPV (human papilloma virus) infection    Hyperlipidemia    Hypertension    Pancreatitis     History reviewed. No pertinent surgical history.  Family History  Problem Relation Age of Onset   Other Father        healthy   Diabetes Mother    Polycystic ovary syndrome Sister     Social History   Socioeconomic History   Marital status: Married    Spouse name: Not on file   Number  of children: Not on file   Years of education: Not on file   Highest education level: Not on file  Occupational History   Not on file  Tobacco Use   Smoking status: Every Day   Smokeless tobacco: Never  Substance and Sexual Activity   Alcohol use: Yes    Comment: wine daily   Drug use: Never   Sexual activity: Yes    Birth control/protection: Condom  Other Topics Concern   Not on file  Social History Narrative   Not on file   Social Determinants of Health   Financial Resource Strain: Low Risk  (06/14/2020)   Overall Financial Resource Strain (CARDIA)    Difficulty of Paying Living Expenses: Not hard at all  Food Insecurity: No Food Insecurity (06/14/2020)   Hunger Vital Sign    Worried About Running Out of Food in the Last Year: Never true    Ran Out of Food in the Last Year: Never true  Transportation Needs: No Transportation Needs (06/14/2020)   PRAPARE - Administrator, Civil Service (Medical): No    Lack of Transportation (Non-Medical): No  Physical Activity: Sufficiently Active (06/14/2020)   Exercise Vital Sign    Days of Exercise per Week: 3 days    Minutes of Exercise per Session: 60 min  Stress: No Stress Concern Present (06/14/2020)   [employer]  of Occupational Health - Occupational Stress Questionnaire    Feeling of Stress : Not at all  Social Connections: Socially Integrated (06/14/2020)   Social Connection and Isolation Panel [NHANES]    Frequency of Communication with Friends and Family: More than three times a week    Frequency of Social Gatherings with Friends and Family: More than three times a week    Attends Religious Services: More than 4 times per year    Active Member of [employer] or Organizations: No    Attends Engineer, structural: More than 4 times per year    Marital Status: Married  Catering manager Violence: Not At Risk (06/14/2020)   Humiliation, Afraid, Rape, and Kick questionnaire    Fear of Current or Ex-Partner: No     Emotionally Abused: No    Physically Abused: No    Sexually Abused: No    Outpatient Medications Prior to Visit  Medication Sig Dispense Refill   blood glucose meter kit and supplies KIT Dispense based on patient and insurance preference. Use up to four times daily as directed. 1 each 0   ipratropium (ATROVENT) 0.03 % nasal spray Place 2 sprays into both nostrils 2 (two) times daily as needed for rhinitis. 30 mL 0   loratadine (CLARITIN) 10 MG tablet Take 10 mg by mouth daily. As needed     metFORMIN (GLUCOPHAGE-XR) 500 MG 24 hr tablet Take 1 tablet (500 mg total) by mouth daily with breakfast. 90 tablet 3   pantoprazole (PROTONIX) 40 MG tablet Take 1 tablet (40 mg total) by mouth daily. 30 tablet 5   rosuvastatin (CRESTOR) 10 MG tablet Take 1 tablet (10 mg total) by mouth daily. 90 tablet 3   telmisartan-hydrochlorothiazide (MICARDIS HCT) 80-12.5 MG tablet Take 1 tablet by mouth daily. 90 tablet 1   No facility-administered medications prior to visit.    No Known Allergies  ROS Review of Systems  Constitutional:  Negative for chills and fever.  HENT:  Negative for congestion, sinus pressure, sinus pain and sore throat.   Eyes:  Negative for pain and discharge.  Respiratory:  Negative for cough and shortness of breath.   Cardiovascular:  Negative for chest pain and palpitations.  Gastrointestinal:  Negative for abdominal pain, constipation, diarrhea, nausea and vomiting.  Endocrine: Negative for polydipsia and polyuria.  Genitourinary:  Negative for dysuria, hematuria, vaginal discharge and vaginal pain.  Musculoskeletal:  Negative for neck pain and neck stiffness.  Skin:  Negative for rash.  Neurological:  Negative for dizziness and weakness.  Psychiatric/Behavioral:  Negative for agitation and behavioral problems.       Objective:    Physical Exam Vitals reviewed.  Constitutional:      General: She is not in acute distress.    Appearance: She is obese. She is not  diaphoretic.  HENT:     Head: Normocephalic and atraumatic.     Nose: Nose normal.     Mouth/Throat:     Mouth: Mucous membranes are moist.  Eyes:     General: No scleral icterus.    Extraocular Movements: Extraocular movements intact.  Cardiovascular:     Rate and Rhythm: Normal rate and regular rhythm.     Pulses: Normal pulses.     Heart sounds: Normal heart sounds. No murmur heard. Pulmonary:     Breath sounds: Normal breath sounds. No wheezing or rales.  Abdominal:     General: Bowel sounds are normal.     Palpations: Abdomen is soft.  Tenderness: There is no abdominal tenderness.  Musculoskeletal:     Cervical back: Neck supple. No tenderness.     Right lower leg: No edema.     Left lower leg: No edema.  Skin:    General: Skin is warm.     Findings: No rash.  Neurological:     General: No focal deficit present.     Mental Status: She is alert and oriented to person, place, and time.     Sensory: No sensory deficit.     Motor: No weakness.  Psychiatric:        Mood and Affect: Mood normal.        Behavior: Behavior normal.     BP 126/83 (BP Location: Right Arm, Patient Position: Sitting, Cuff Size: Normal)   Pulse (!) 108   Ht 5\' 2"  (1.575 m)   Wt 196 lb 9.6 oz (89.2 kg)   SpO2 98%   BMI 35.96 kg/m  Wt Readings from Last 3 Encounters:  02/01/23 196 lb 9.6 oz (89.2 kg)  09/30/22 200 lb 3.2 oz (90.8 kg)  08/28/22 196 lb 9.6 oz (89.2 kg)    Lab Results  Component Value Date   TSH 1.500 02/13/2022   Lab Results  Component Value Date   WBC 5.2 02/13/2022   HGB 12.8 02/13/2022   HCT 36.5 02/13/2022   MCV 96 02/13/2022   PLT 385 02/13/2022   Lab Results  Component Value Date   NA 135 05/15/2022   K 3.7 05/15/2022   CO2 23 05/15/2022   GLUCOSE 91 05/15/2022   BUN 13 05/15/2022   CREATININE 0.92 05/15/2022   BILITOT 0.6 05/15/2022   ALKPHOS 61 05/15/2022   AST 31 05/15/2022   ALT 25 05/15/2022   PROT 7.7 05/15/2022   ALBUMIN 5.1 (H) 05/15/2022    CALCIUM 9.6 05/15/2022   EGFR 81 05/15/2022   Lab Results  Component Value Date   CHOL 121 05/15/2022   Lab Results  Component Value Date   HDL 40 05/15/2022   Lab Results  Component Value Date   LDLCALC 21 05/15/2022   Lab Results  Component Value Date   TRIG 435 (H) 05/15/2022   Lab Results  Component Value Date   CHOLHDL 3.0 05/15/2022   Lab Results  Component Value Date   HGBA1C 7.0 09/30/2022      Assessment & Plan:   Problem List Items Addressed This Visit       Cardiovascular and Mediastinum   Primary hypertension    BP Readings from Last 1 Encounters:  02/01/23 126/83   Well-controlled with  telmisartan-HCTZ 80-12.5 mg daily HTN also likely due to alcohol dependence, needs to cut down Counseled for compliance with the medications Advised DASH diet and moderate exercise/walking, at least 150 mins/week      Relevant Medications   telmisartan-hydrochlorothiazide (MICARDIS HCT) 80-12.5 MG tablet   rosuvastatin (CRESTOR) 10 MG tablet     Digestive   Chronic pancreatitis (HCC)    Related to alcohol use and/or hypertriglyceridemia Has had ER visits for pancreatitis, advised to increase fluid intake and avoid alcohol use Needs to follow-up with GI for possible need for EUS Soft diet, advance as tolerated Check CMP and lipid profile      Relevant Medications   pantoprazole (PROTONIX) 40 MG tablet   Gastroesophageal reflux disease    Well-controlled with Protonix Can take Pepcid in the evening as needed      Relevant Medications   pantoprazole (PROTONIX) 40 MG  tablet     Endocrine   Type 2 diabetes mellitus with other specified complication [facility])    Lab Results  Component Value Date   HGBA1C 7.0 09/30/2022   Associated with HTN and HLD Well-controlled, but worse recently due to noncompliance to diet and alcohol use On Metformin 500 mg QD Advised to follow diabetic diet On statin and ARB F/u CMP and lipid panel Diabetic eye exam: Advised  to follow up with Ophthalmology for diabetic eye exam      Relevant Medications   telmisartan-hydrochlorothiazide (MICARDIS HCT) 80-12.5 MG tablet   rosuvastatin (CRESTOR) 10 MG tablet     Other   Hyperlipidemia    Check lipid profile On Crestor -compliance is questionable Advised to take omega-3 Was not able to get Vascepa due to insurance coverage concern      Relevant Medications   telmisartan-hydrochlorothiazide (MICARDIS HCT) 80-12.5 MG tablet   rosuvastatin (CRESTOR) 10 MG tablet   Tobacco abuse    Smokes 1 pack/week  Asked about quitting: confirms that she currently smokes cigarettes Advise to quit smoking: Educated about QUITTING to reduce the risk of cancer, cardio and cerebrovascular disease. Assess willingness: Unwilling to quit at this time, but is working on cutting back. Assist with counseling and pharmacotherapy: Counseled for 5 minutes and literature provided. Arrange for follow up: If not quitting follow up in 3 months and continue to offer help.      Alcohol dependence (HCC)    Used to take 3-5 drinks of vodka every day, has been cutting down H/o recurrent pancreatitis, f/u CMP Advised to quit alcohol use - support group information provided      Encounter for general adult medical examination with abnormal findings - Primary    Physical exam as documented. Counseling done  re healthy lifestyle involving commitment to 150 minutes exercise per week, heart healthy diet, and attaining healthy weight.The importance of adequate sleep also discussed. Changes in health habits are decided on by the patient with goals and time frames  set for achieving them. Immunization and cancer screening needs are specifically addressed at this visit.       Meds ordered this encounter  Medications   telmisartan-hydrochlorothiazide (MICARDIS HCT) 80-12.5 MG tablet    Sig: Take 1 tablet by mouth daily.    Dispense:  90 tablet    Refill:  1   rosuvastatin (CRESTOR) 10 MG  tablet    Sig: Take 1 tablet (10 mg total) by mouth daily.    Dispense:  90 tablet    Refill:  3   pantoprazole (PROTONIX) 40 MG tablet    Sig: Take 1 tablet (40 mg total) by mouth daily.    Dispense:  30 tablet    Refill:  5    Follow-up: Return in about 4 months (around 06/04/2023) for DM and HTN.    Anabel Halon, MD

## 2023-02-01 NOTE — Assessment & Plan Note (Signed)

## 2023-02-01 NOTE — Assessment & Plan Note (Addendum)
Related to alcohol use and/or hypertriglyceridemia Has had ER visits for pancreatitis, advised to increase fluid intake and avoid alcohol use Needs to follow-up with GI for possible need for EUS Soft diet, advance as tolerated Check CMP and lipid profile

## 2023-02-01 NOTE — Patient Instructions (Addendum)
Please start taking Magnesium supplement (200 mg) once daily and B complex once daily.  Please continue to take medications as prescribed.  Please continue to follow low carb diet and perform moderate exercise/walking at least 150 mins/week.

## 2023-02-02 LAB — CMP14+EGFR
ALT: 36 [IU]/L — ABNORMAL HIGH (ref 0–32)
AST: 29 [IU]/L (ref 0–40)
Albumin: 4.5 g/dL (ref 3.9–4.9)
Alkaline Phosphatase: 66 [IU]/L (ref 44–121)
BUN/Creatinine Ratio: 9 (ref 9–23)
BUN: 10 mg/dL (ref 6–20)
Bilirubin Total: 0.9 mg/dL (ref 0.0–1.2)
CO2: 21 mmol/L (ref 20–29)
Calcium: 9.2 mg/dL (ref 8.7–10.2)
Chloride: 91 mmol/L — ABNORMAL LOW (ref 96–106)
Creatinine, Ser: 1.06 mg/dL — ABNORMAL HIGH (ref 0.57–1.00)
Globulin, Total: 3 g/dL (ref 1.5–4.5)
Glucose: 224 mg/dL — ABNORMAL HIGH (ref 70–99)
Potassium: 4.2 mmol/L (ref 3.5–5.2)
Sodium: 131 mmol/L — ABNORMAL LOW (ref 134–144)
Total Protein: 7.5 g/dL (ref 6.0–8.5)
eGFR: 69 mL/min/{1.73_m2} (ref >59)

## 2023-02-02 LAB — CBC WITH DIFFERENTIAL/PLATELET
Basophils Absolute: 0.1 10*3/uL (ref 0.0–0.2)
Basos: 1 %
EOS (ABSOLUTE): 0.1 10*3/uL (ref 0.0–0.4)
Eos: 1 %
Hematocrit: 36.9 % (ref 34.0–46.6)
Hemoglobin: 12.5 g/dL (ref 11.1–15.9)
Immature Grans (Abs): 0.1 10*3/uL (ref 0.0–0.1)
Immature Granulocytes: 1 %
Lymphocytes Absolute: 1.8 10*3/uL (ref 0.7–3.1)
Lymphs: 21 %
MCH: 32.3 pg (ref 26.6–33.0)
MCHC: 33.9 g/dL (ref 31.5–35.7)
MCV: 95 fL (ref 79–97)
Monocytes Absolute: 0.6 10*3/uL (ref 0.1–0.9)
Monocytes: 7 %
Neutrophils Absolute: 6 10*3/uL (ref 1.4–7.0)
Neutrophils: 69 %
Platelets: 334 10*3/uL (ref 150–450)
RBC: 3.87 x10E6/uL (ref 3.77–5.28)
RDW: 15.8 % — ABNORMAL HIGH (ref 11.7–15.4)
WBC: 8.5 10*3/uL (ref 3.4–10.8)

## 2023-02-02 LAB — LIPID PANEL
Chol/HDL Ratio: 5.5 {ratio} — ABNORMAL HIGH (ref 0.0–4.4)
Cholesterol, Total: 126 mg/dL (ref 100–199)
HDL: 23 mg/dL — ABNORMAL LOW (ref >39)
Triglycerides: 850 mg/dL (ref 0–149)

## 2023-02-02 LAB — TSH: TSH: 1.8 u[IU]/mL (ref 0.450–4.500)

## 2023-02-02 LAB — HIV ANTIBODY (ROUTINE TESTING W REFLEX): HIV Screen 4th Generation wRfx: NONREACTIVE

## 2023-02-02 LAB — HEMOGLOBIN A1C
Est. average glucose Bld gHb Est-mCnc: 203 mg/dL
Hgb A1c MFr Bld: 8.7 % — ABNORMAL HIGH (ref 4.8–5.6)

## 2023-02-02 LAB — HEPATITIS C ANTIBODY: Hep C Virus Ab: NONREACTIVE

## 2023-02-23 ENCOUNTER — Telehealth: Payer: Self-pay | Admitting: Internal Medicine

## 2023-02-23 NOTE — Telephone Encounter (Signed)
Disability forms  Noted Copied Sleeved (put in provider box)

## 2023-03-01 NOTE — Telephone Encounter (Signed)
Called patient phone # not in service. Forms front folder

## 2023-03-05 ENCOUNTER — Telehealth: Payer: Self-pay | Admitting: Internal Medicine

## 2023-03-05 NOTE — Telephone Encounter (Signed)
Copied from CRM [id number]. Topic: General - Other >> Mar 03, 2023  1:48 PM Larwance Sachs wrote:    Reason for CRM: Patient reach out regarding accomodation form for employer faxed in 02/14/2023, patient is interest in status of form

## 2023-03-05 NOTE — Telephone Encounter (Signed)
Spoke to patient, paperwork was filled out and faxed back over a week ago

## 2023-04-23 ENCOUNTER — Telehealth (INDEPENDENT_AMBULATORY_CARE_PROVIDER_SITE_OTHER): Payer: 59 | Admitting: Internal Medicine

## 2023-04-23 ENCOUNTER — Encounter: Payer: Self-pay | Admitting: Internal Medicine

## 2023-04-23 ENCOUNTER — Ambulatory Visit: Payer: Self-pay | Admitting: Internal Medicine

## 2023-04-23 DIAGNOSIS — E1169 Type 2 diabetes mellitus with other specified complication: Secondary | ICD-10-CM | POA: Diagnosis not present

## 2023-04-23 DIAGNOSIS — Z7984 Long term (current) use of oral hypoglycemic drugs: Secondary | ICD-10-CM | POA: Diagnosis not present

## 2023-04-23 DIAGNOSIS — F10288 Alcohol dependence with other alcohol-induced disorder: Secondary | ICD-10-CM

## 2023-04-23 MED ORDER — GLIPIZIDE 5 MG PO TABS: 5.0000 mg | ORAL_TABLET | Freq: Two times a day (BID) | ORAL | 3 refills | Status: DC

## 2023-04-23 MED ORDER — METFORMIN HCL ER 500 MG PO TB24: 500.0000 mg | ORAL_TABLET | Freq: Two times a day (BID) | ORAL | 5 refills | Status: DC

## 2023-04-23 NOTE — Telephone Encounter (Signed)
Video scheduled , patient aware

## 2023-04-23 NOTE — Telephone Encounter (Signed)
  Chief Complaint: blood sugar problem Symptoms: thirsty, frequent urination, intermittent blurry vision Frequency: 3 weeks Pertinent Negatives: Patient denies LOC, ability to test blood sugar Disposition: [] ED /[x] Urgent Care (no appt availability in office) / [x] Appointment(In office/virtual)/ []  [facility]/ [] Home Care/ [x] Refused Recommended Disposition /[] [place] [facility]/ []  Follow-up with PCP Additional Notes:  Patient reports she was recently diagnosed with type 2 diabetes this year and has been taking metformin as prescribed. Patient reports for the past 3 weeks she has been experiencing extreme thirst, frequent urination, and intermittent blurry vision. Patient reports she does not check her blood sugar at home but that the last time she got it checked in office it was elevated. Patient reports these are similar to the symptoms she had when she was diagnosed and does not feel like her medications are helping. This RN attempted to schedule in office visit. No availability until next week. This RN also attempted to schedule UC but patient reports she is not sure if her insurance will cover UC. Patient reports that she will call her insurance provider to verify coverage and will go to a walk in UC if it is covered. This RN emphasized the importance of being seen for her symptoms and having her sugar checked. Patient advised to call back with worsening symptoms or questions. Patient verbalized understanding.      Copied from CRM [id number]. Topic: Clinical - Red Word Triage >> Apr 23, 2023  9:45 AM Dennison Nancy wrote: Red Word that prompted transfer to Nurse Triage: patient has concerns diagnois as having Diabetes  ,waking up urinating  eye blurring , dry mouth and on metFORMIN    not sure if need stronger medication Reason for Disposition  [1] Symptoms of high blood sugar (e.g., abnormally thirsty, frequent urination, weight loss) AND [2] not able to test blood glucose  Answer  Assessment - Initial Assessment Questions 1. BLOOD GLUCOSE: "What is your blood glucose level?"      N/A 2. ONSET: "When did you check the blood glucose?"     The last few weeks 3. USUAL RANGE: "What is your glucose level usually?" (e.g., usual fasting morning value, usual evening value)     "I'm not sure" 4. KETONES: "Do you check for ketones (urine or blood test strips)?" If Yes, ask: "What does the test show now?"      no 5. TYPE 1 or 2:  "Do you know what type of diabetes you have?"  (e.g., Type 1, Type 2, Gestational; doesn't know)      Type 2 6. INSULIN: "Do you take insulin?" "What type of insulin(s) do you use? What is the mode of delivery? (syringe, pen; injection or pump)?"      no 7. DIABETES PILLS: "Do you take any pills for your diabetes?" If Yes, ask: "Have you missed taking any pills recently?"     metformin 8. OTHER SYMPTOMS: "Do you have any symptoms?" (e.g., fever, frequent urination, difficulty breathing, dizziness, weakness, vomiting)  Frequent urination , thirsty, blurry vision  Protocols used: Diabetes - High Blood Sugar-A-AH

## 2023-04-23 NOTE — Progress Notes (Signed)
Virtual Visit via Video Note   Because of Tracy Ortiz's co-morbid illnesses, she is at least at moderate risk for complications without adequate follow up.  This format is felt to be most appropriate for this patient at this time.  All issues noted in this document were discussed and addressed.  A limited physical exam was performed with this format.      Evaluation Performed:  Follow-up visit  Date:  04/23/2023   ID:  Tracy Ortiz, DOB 1980/09/10, MRN [id number]  Patient Location: Home Provider Location: Office/Clinic  Participants: Patient Location of Patient: Home Location of Provider: Telehealth Consent was obtain for visit to be over via telehealth. I verified that I am speaking with the correct person using two identifiers.  PCP:  Anabel Halon, MD   Chief Complaint: Polyuria, polydipsia  History of Present Illness:    Tracy Ortiz is a 42 y.o. female with PMH of hypertension, type 2 DM, sarcoidosis, hyperlipidemia, alcohol and tobacco abuse who has a video visit for c/o polyuria and polydipsia for the last 2 weeks.  She has history of type II DM, last HbA1c was 8.5.  She takes metformin 500 mg QD.  She checks blood glucose inconsistently, her blood glucose was 350 this morning.  She has history of alcohol abuse and alcohol induced acute pancreatitis as well.  She admits that she still has been drinking alcohol everyday. She agrees that she needs to cut down.  Denies any episode of confusion, visual disturbance, focal numbness or tingling, abdominal pain or diarrhea currently.  The patient does not have symptoms concerning for COVID-19 infection (fever, chills, cough, or new shortness of breath).   Past Medical, Surgical, Social History, Allergies, and Medications have been Reviewed.  Past Medical History:  Diagnosis Date   HPV (human papilloma virus) infection    Hyperlipidemia    Hypertension    Pancreatitis    History reviewed. No pertinent surgical  history.   Current Meds  Medication Sig   glipiZIDE (GLUCOTROL) 5 MG tablet Take 1 tablet (5 mg total) by mouth 2 (two) times daily before a meal.     Allergies:   Patient has no known allergies.   ROS:   Please see the history of present illness.    All other systems reviewed and are negative.   Labs/Other Tests and Data Reviewed:    Recent Labs: 02/01/2023: ALT 36; BUN 10; Creatinine, Ser 1.06; Hemoglobin 12.5; Platelets 334; Potassium 4.2; Sodium 131; TSH 1.800   Recent Lipid Panel Lab Results  Component Value Date/Time   CHOL 126 02/01/2023 03:30 PM   TRIG 850 (HH) 02/01/2023 03:30 PM   HDL 23 (L) 02/01/2023 03:30 PM   CHOLHDL 5.5 (H) 02/01/2023 03:30 PM   LDLCALC Comment (A) 02/01/2023 03:30 PM    Wt Readings from Last 3 Encounters:  02/01/23 196 lb 9.6 oz (89.2 kg)  09/30/22 200 lb 3.2 oz (90.8 kg)  08/28/22 196 lb 9.6 oz (89.2 kg)     Objective:    Vital Signs:  There were no vitals taken for this visit.   VITAL SIGNS:  reviewed GEN:  no acute distress EYES:  sclerae anicteric, EOMI - Extraocular Movements Intact RESPIRATORY:  normal respiratory effort, symmetric expansion NEURO:  alert and oriented x 3, no obvious focal deficit PSYCH:  normal affect  ASSESSMENT & PLAN:    Type 2 diabetes mellitus with other specified complication (HCC) Lab Results  Component Value Date   HGBA1C 8.7 (H)  02/01/2023   Associated with HTN and HLD Uncontrolled, but worse recently due to noncompliance to diet and alcohol use On Metformin 500 mg once daily, increased dose to 500 mg BID Started glipizide 5 mg twice daily before meals Advised to check blood glucose at least twice daily and contact if blood glucose more than 300 or less than 70 Advised to follow diabetic diet On statin and ARB F/u CMP and lipid panel Diabetic eye exam: Advised to follow up with Ophthalmology for diabetic eye exam  Alcohol dependence (HCC) Used to take 3-5 drinks of vodka every day, has  been cutting down H/o recurrent pancreatitis, f/u CMP Advised to quit alcohol use - support group information has been provided   I discussed the assessment and treatment plan with the patient. The patient was provided an opportunity to ask questions, and all were answered. The patient agreed with the plan and demonstrated an understanding of the instructions.   The patient was advised to call back or seek an in-person evaluation if the symptoms worsen or if the condition fails to improve as anticipated.  The above assessment and management plan was discussed with the patient. The patient verbalized understanding of and has agreed to the management plan.   Medication Adjustments/Labs and Tests Ordered: Current medicines are reviewed at length with the patient today.  Concerns regarding medicines are outlined above.   Tests Ordered: No orders of the defined types were placed in this encounter.   Medication Changes: Meds ordered this encounter  Medications   glipiZIDE (GLUCOTROL) 5 MG tablet    Sig: Take 1 tablet (5 mg total) by mouth 2 (two) times daily before a meal.    Dispense:  60 tablet    Refill:  3   metFORMIN (GLUCOPHAGE-XR) 500 MG 24 hr tablet    Sig: Take 1 tablet (500 mg total) by mouth 2 (two) times daily with a meal.    Dispense:  60 tablet    Refill:  5     Note: This dictation was prepared with Dragon dictation along with smaller phrase technology. Similar sounding words can be transcribed inadequately or may not be corrected upon review. Any transcriptional errors that result from this process are unintentional.      Disposition:  Follow up  Signed, Anabel Halon, MD  04/23/2023 12:11 PM     [facility]

## 2023-04-23 NOTE — Assessment & Plan Note (Signed)
Lab Results  Component Value Date   HGBA1C 8.7 (H) 02/01/2023   Associated with HTN and HLD Uncontrolled, but worse recently due to noncompliance to diet and alcohol use On Metformin 500 mg once daily, increased dose to 500 mg BID Started glipizide 5 mg twice daily before meals Advised to check blood glucose at least twice daily and contact if blood glucose more than 300 or less than 70 Advised to follow diabetic diet On statin and ARB F/u CMP and lipid panel Diabetic eye exam: Advised to follow up with Ophthalmology for diabetic eye exam

## 2023-04-23 NOTE — Patient Instructions (Signed)
Please start taking metformin 500 mg twice daily and glipizide 5 mg twice daily before meals.  Please check blood glucose at least twice daily, and contact us if it is more than 300 or less than 70.  Please continue to take other medications as prescribed.  Please continue to follow low carb diet and perform moderate exercise/walking at least 150 mins/week.

## 2023-04-23 NOTE — Assessment & Plan Note (Addendum)
Used to take 3-5 drinks of vodka every day, has been cutting down H/o recurrent pancreatitis, f/u CMP Advised to quit alcohol use - support group information has been provided

## 2023-04-30 ENCOUNTER — Encounter: Payer: Self-pay | Admitting: Internal Medicine

## 2023-04-30 NOTE — Telephone Encounter (Signed)
 scheduled

## 2023-05-04 ENCOUNTER — Ambulatory Visit: Payer: 59 | Admitting: Internal Medicine

## 2023-05-04 ENCOUNTER — Encounter: Payer: Self-pay | Admitting: Internal Medicine

## 2023-05-04 VITALS — BP 117/80 | HR 103 | Ht 62.0 in | Wt 192.4 lb

## 2023-05-04 DIAGNOSIS — I10 Essential (primary) hypertension: Secondary | ICD-10-CM | POA: Diagnosis not present

## 2023-05-04 DIAGNOSIS — E782 Mixed hyperlipidemia: Secondary | ICD-10-CM | POA: Diagnosis not present

## 2023-05-04 DIAGNOSIS — E1169 Type 2 diabetes mellitus with other specified complication: Secondary | ICD-10-CM | POA: Diagnosis not present

## 2023-05-04 DIAGNOSIS — Z7984 Long term (current) use of oral hypoglycemic drugs: Secondary | ICD-10-CM

## 2023-05-04 NOTE — Progress Notes (Signed)
 Established Patient Office Visit  Subjective:  Patient ID: Tracy Ortiz, female    DOB: 1981-02-13  Age: 43 y.o. MRN: [id number]  CC:  Chief Complaint  Patient presents with   Follow-up    Discuss medication for diabetes     HPI Tracy Ortiz is a 43 y.o. female with past medical history of hypertension, sarcoidosis, hyperlipidemia, alcohol and tobacco abuse who presents for f/u of her chronic medical conditions.  HTN: Her BP is wnl today.  She has been taking telmisartan -HCTZ  80-12.5 mg once daily. She denies any headache, dizziness, chest pain, dyspnea or palpitations currently.  Type II DM: Her HbA1c was 8.7 in 10/24.  She has been taking metformin  500 mg BID and recently started taking glipizide  5 mg BID. Her blood glucose had been in 200s mostly, but has been slightly better in the last week - around 120s-200. She reports that she has skipped glipizide  dose if her mealtime blood glucose is around 110, but has morning hyperglycemia the next day. She has chronic fatigue, but denies any polyuria or polyphagia.  Her alcohol intake has been worsening her glycemic profile, she understands that and has tried to avoid it for the last 10 days.  HLD: She takes Crestor , but could not take Vascepa  as her insurance did not cover it.  Recurrent pancreatitis: She has had multiple episodes of pancreatitis, likely due to alcohol dependence.  She takes 3-5 drinks of vodka a day and states that she takes it as she sees her husband drinking it daily.  She acknowledges that she needs to cut down.  She currently denies any abdominal pain, nausea or vomiting.  Her last drink was 10 days ago.  After discussion, she has agreed to explore local support groups, but does not join it.  She has had GI evaluation in the past, but did not follow-up after initial consultation.      Past Medical History:  Diagnosis Date   HPV (human papilloma virus) infection    Hyperlipidemia    Hypertension     Pancreatitis     History reviewed. No pertinent surgical history.  Family History  Problem Relation Age of Onset   Other Father        healthy   Diabetes Mother    Polycystic ovary syndrome Sister     Social History   Socioeconomic History   Marital status: Married    Spouse name: Not on file   Number of children: Not on file   Years of education: Not on file   Highest education level: Not on file  Occupational History   Not on file  Tobacco Use   Smoking status: Every Day   Smokeless tobacco: Never  Substance and Sexual Activity   Alcohol use: Yes    Comment: wine daily   Drug use: Never   Sexual activity: Yes    Birth control/protection: Condom  Other Topics Concern   Not on file  Social History Narrative   Not on file   Social Drivers of Health   Financial Resource Strain: Low Risk  (06/14/2020)   Overall Financial Resource Strain (CARDIA)    Difficulty of Paying Living Expenses: Not hard at all  Food Insecurity: No Food Insecurity (06/14/2020)   Hunger Vital Sign    Worried About Running Out of Food in the Last Year: Never true    Ran Out of Food in the Last Year: Never true  Transportation Needs: No Transportation Needs (06/14/2020)   PRAPARE -  Administrator, Civil Service (Medical): No    Lack of Transportation (Non-Medical): No  Physical Activity: Sufficiently Active (06/14/2020)   Exercise Vital Sign    Days of Exercise per Week: 3 days    Minutes of Exercise per Session: 60 min  Stress: No Stress Concern Present (06/14/2020)   [employer] of Occupational Health - Occupational Stress Questionnaire    Feeling of Stress : Not at all  Social Connections: Socially Integrated (06/14/2020)   Social Connection and Isolation Panel [NHANES]    Frequency of Communication with Friends and Family: More than three times a week    Frequency of Social Gatherings with Friends and Family: More than three times a week    Attends Religious Services: More  than 4 times per year    Active Member of [employer] or Organizations: No    Attends Engineer, Structural: More than 4 times per year    Marital Status: Married  Catering Manager Violence: Not At Risk (06/14/2020)   Humiliation, Afraid, Rape, and Kick questionnaire    Fear of Current or Ex-Partner: No    Emotionally Abused: No    Physically Abused: No    Sexually Abused: No    Outpatient Medications Prior to Visit  Medication Sig Dispense Refill   blood glucose meter kit and supplies KIT Dispense based on patient and insurance preference. Use up to four times daily as directed. 1 each 0   glipiZIDE  (GLUCOTROL ) 5 MG tablet Take 1 tablet (5 mg total) by mouth 2 (two) times daily before a meal. 60 tablet 3   ipratropium (ATROVENT ) 0.03 % nasal spray Place 2 sprays into both nostrils 2 (two) times daily as needed for rhinitis. 30 mL 0   loratadine (CLARITIN) 10 MG tablet Take 10 mg by mouth daily. As needed     metFORMIN  (GLUCOPHAGE -XR) 500 MG 24 hr tablet Take 1 tablet (500 mg total) by mouth 2 (two) times daily with a meal. 60 tablet 5   pantoprazole  (PROTONIX ) 40 MG tablet Take 1 tablet (40 mg total) by mouth daily. 30 tablet 5   rosuvastatin  (CRESTOR ) 10 MG tablet Take 1 tablet (10 mg total) by mouth daily. 90 tablet 3   telmisartan -hydrochlorothiazide (MICARDIS  HCT) 80-12.5 MG tablet Take 1 tablet by mouth daily. 90 tablet 1   No facility-administered medications prior to visit.    No Known Allergies  ROS Review of Systems  Constitutional:  Negative for chills and fever.  HENT:  Negative for congestion, sinus pressure, sinus pain and sore throat.   Eyes:  Negative for pain and discharge.  Respiratory:  Negative for cough and shortness of breath.   Cardiovascular:  Negative for chest pain and palpitations.  Gastrointestinal:  Negative for abdominal pain, constipation, diarrhea, nausea and vomiting.  Endocrine: Negative for polydipsia and polyuria.  Genitourinary:  Negative for  dysuria, hematuria, vaginal discharge and vaginal pain.  Musculoskeletal:  Negative for neck pain and neck stiffness.  Skin:  Negative for rash.  Neurological:  Negative for dizziness and weakness.  Psychiatric/Behavioral:  Negative for agitation and behavioral problems.       Objective:    Physical Exam Vitals reviewed.  Constitutional:      General: She is not in acute distress.    Appearance: She is obese. She is not diaphoretic.  HENT:     Head: Normocephalic and atraumatic.     Nose: Nose normal.     Mouth/Throat:     Mouth: Mucous membranes  are moist.  Eyes:     General: No scleral icterus.    Extraocular Movements: Extraocular movements intact.  Cardiovascular:     Rate and Rhythm: Normal rate and regular rhythm.     Pulses: Normal pulses.     Heart sounds: Normal heart sounds. No murmur heard. Pulmonary:     Breath sounds: Normal breath sounds. No wheezing or rales.  Musculoskeletal:     Cervical back: Neck supple. No tenderness.     Right lower leg: No edema.     Left lower leg: No edema.  Skin:    General: Skin is warm.     Findings: No rash.  Neurological:     General: No focal deficit present.     Mental Status: She is alert and oriented to person, place, and time.     Sensory: No sensory deficit.     Motor: No weakness.  Psychiatric:        Mood and Affect: Mood normal.        Behavior: Behavior normal.     BP 117/80 (BP Location: Right Arm, Patient Position: Sitting, Cuff Size: Large)   Pulse (!) 103   Ht 5' 2 (1.575 m)   Wt 192 lb 6.4 oz (87.3 kg)   SpO2 97%   BMI 35.19 kg/m  Wt Readings from Last 3 Encounters:  05/04/23 192 lb 6.4 oz (87.3 kg)  02/01/23 196 lb 9.6 oz (89.2 kg)  09/30/22 200 lb 3.2 oz (90.8 kg)    Lab Results  Component Value Date   TSH 1.800 02/01/2023   Lab Results  Component Value Date   WBC 8.5 02/01/2023   HGB 12.5 02/01/2023   HCT 36.9 02/01/2023   MCV 95 02/01/2023   PLT 334 02/01/2023   Lab Results   Component Value Date   NA 131 (L) 02/01/2023   K 4.2 02/01/2023   CO2 21 02/01/2023   GLUCOSE 224 (H) 02/01/2023   BUN 10 02/01/2023   CREATININE 1.06 (H) 02/01/2023   BILITOT 0.9 02/01/2023   ALKPHOS 66 02/01/2023   AST 29 02/01/2023   ALT 36 (H) 02/01/2023   PROT 7.5 02/01/2023   ALBUMIN 4.5 02/01/2023   CALCIUM  9.2 02/01/2023   EGFR 69 02/01/2023   Lab Results  Component Value Date   CHOL 126 02/01/2023   Lab Results  Component Value Date   HDL 23 (L) 02/01/2023   Lab Results  Component Value Date   LDLCALC Comment (A) 02/01/2023   Lab Results  Component Value Date   TRIG 850 (HH) 02/01/2023   Lab Results  Component Value Date   CHOLHDL 5.5 (H) 02/01/2023   Lab Results  Component Value Date   HGBA1C 8.7 (H) 02/01/2023      Assessment & Plan:   Problem List Items Addressed This Visit       Cardiovascular and Mediastinum   Primary hypertension   BP Readings from Last 1 Encounters:  05/04/23 117/80   Well-controlled with  telmisartan -HCTZ 80-12.5 mg daily HTN also likely due to alcohol dependence, needs to cut down Counseled for compliance with the medications Advised DASH diet and moderate exercise/walking, at least 150 mins/week        Endocrine   Type 2 diabetes mellitus with other specified complication (HCC) - Primary   Lab Results  Component Value Date   HGBA1C 8.7 (H) 02/01/2023   Associated with HTN and HLD Uncontrolled, worse recently due to noncompliance to diet and alcohol use On Metformin   500 mg BID Started glipizide  5 mg twice daily before meals recently - advised to take it before breakfast and dinnertime regularly and avoid skipping meals  Home blood glucose was in 200s for 1 week after starting Glipizide , but has been in 100s now for the last 1 week  Advised to check blood glucose at least twice daily and contact if blood glucose more than 300 or less than 70  If persistent hyperglycemia, will need insulin Advised to follow  diabetic diet On statin and ARB F/u CMP and lipid panel Diabetic eye exam: Advised to follow up with Ophthalmology for diabetic eye exam      Relevant Orders   CMP14+EGFR   Hemoglobin A1c     Other   Hyperlipidemia   Check lipid profile On Crestor  -compliance is questionable Advised to take omega-3 Was not able to get Vascepa  due to insurance coverage concern      Relevant Orders   Lipid Profile     No orders of the defined types were placed in this encounter.   Follow-up: Return if symptoms worsen or fail to improve.    Suzzane MARLA Blanch, MD

## 2023-05-04 NOTE — Patient Instructions (Signed)
 Please start taking Glipizide before breakfast and dinner.  Please continue taking Metformin as prescribed.  Please continue to follow low carb diet and perform moderate exercise/walking at least 150 mins/week.

## 2023-05-04 NOTE — Assessment & Plan Note (Signed)
 Check lipid profile On Crestor -compliance is questionable Advised to take omega-3 Was not able to get Vascepa due to insurance coverage concern

## 2023-05-04 NOTE — Assessment & Plan Note (Signed)
 BP Readings from Last 1 Encounters:  05/04/23 117/80   Well-controlled with  telmisartan -HCTZ 80-12.5 mg daily HTN also likely due to alcohol dependence, needs to cut down Counseled for compliance with the medications Advised DASH diet and moderate exercise/walking, at least 150 mins/week

## 2023-05-04 NOTE — Assessment & Plan Note (Addendum)
 Lab Results  Component Value Date   HGBA1C 8.7 (H) 02/01/2023   Associated with HTN and HLD Uncontrolled, worse recently due to noncompliance to diet and alcohol use On Metformin  500 mg BID Started glipizide  5 mg twice daily before meals recently - advised to take it before breakfast and dinnertime regularly and avoid skipping meals  Home blood glucose was in 200s for 1 week after starting Glipizide , but has been in 100s now for the last 1 week  Advised to check blood glucose at least twice daily and contact if blood glucose more than 300 or less than 70  If persistent hyperglycemia, will need insulin Advised to follow diabetic diet On statin and ARB F/u CMP and lipid panel Diabetic eye exam: Advised to follow up with Ophthalmology for diabetic eye exam

## 2023-06-09 ENCOUNTER — Ambulatory Visit (INDEPENDENT_AMBULATORY_CARE_PROVIDER_SITE_OTHER): Payer: 59 | Admitting: Internal Medicine

## 2023-06-09 ENCOUNTER — Encounter: Payer: Self-pay | Admitting: Internal Medicine

## 2023-06-09 VITALS — BP 114/79 | HR 103 | Ht 62.0 in | Wt 197.2 lb

## 2023-06-09 DIAGNOSIS — I10 Essential (primary) hypertension: Secondary | ICD-10-CM | POA: Diagnosis not present

## 2023-06-09 DIAGNOSIS — E782 Mixed hyperlipidemia: Secondary | ICD-10-CM | POA: Diagnosis not present

## 2023-06-09 DIAGNOSIS — F10288 Alcohol dependence with other alcohol-induced disorder: Secondary | ICD-10-CM

## 2023-06-09 DIAGNOSIS — E1169 Type 2 diabetes mellitus with other specified complication: Secondary | ICD-10-CM | POA: Diagnosis not present

## 2023-06-09 DIAGNOSIS — Z7984 Long term (current) use of oral hypoglycemic drugs: Secondary | ICD-10-CM

## 2023-06-09 DIAGNOSIS — E119 Type 2 diabetes mellitus without complications: Secondary | ICD-10-CM

## 2023-06-09 NOTE — Progress Notes (Signed)
Established Patient Office Visit  Subjective:  Patient ID: Tracy Ortiz, female    DOB: Aug 28, 1980  Age: 43 y.o. MRN: [id number]  CC:  Chief Complaint  Patient presents with   Diabetes   Hypertension    HPI Tracy Ortiz is a 43 y.o. female with past medical history of hypertension, sarcoidosis, hyperlipidemia, alcohol and tobacco abuse who presents for f/u of her chronic medical conditions.  HTN: Her BP is wnl today.  She has been taking telmisartan-HCTZ  80-12.5 mg once daily. She denies any headache, dizziness, chest pain, dyspnea or palpitations currently.  Type II DM: Her HbA1c was 8.7 in 10/24.  She has been taking metformin 500 mg BID and recently started taking glipizide 5 mg BID. Her blood glucose had been in 200s mostly before starting Glipizide, but has been better now - 80s-150s. She has chronic fatigue, but denies any polyuria or polyphagia.  Her alcohol intake has been worsening her glycemic profile, she understands that and has tried to avoid it for the last 2 months.  HLD: She takes Crestor, but could not take Vascepa as her insurance did not cover it.  Recurrent pancreatitis: She has had multiple episodes of pancreatitis, likely due to alcohol dependence.  She has been trying to avoid alcohol since the last visit, but admits that she had few drinks in the last week. She acknowledges that she needs to cut down.  She currently denies any abdominal pain, nausea or vomiting. She has had GI evaluation in the past, but did not follow-up after initial consultation.      Past Medical History:  Diagnosis Date   HPV (human papilloma virus) infection    Hyperlipidemia    Hypertension    Pancreatitis     History reviewed. No pertinent surgical history.  Family History  Problem Relation Age of Onset   Other Father        healthy   Diabetes Mother    Polycystic ovary syndrome Sister     Social History   Socioeconomic History   Marital status: Married     Spouse name: Not on file   Number of children: Not on file   Years of education: Not on file   Highest education level: Not on file  Occupational History   Not on file  Tobacco Use   Smoking status: Every Day   Smokeless tobacco: Never  Substance and Sexual Activity   Alcohol use: Yes    Comment: wine daily   Drug use: Never   Sexual activity: Yes    Birth control/protection: Condom  Other Topics Concern   Not on file  Social History Narrative   Not on file   Social Drivers of Health   Financial Resource Strain: Low Risk  (06/14/2020)   Overall Financial Resource Strain (CARDIA)    Difficulty of Paying Living Expenses: Not hard at all  Food Insecurity: No Food Insecurity (06/14/2020)   Hunger Vital Sign    Worried About Running Out of Food in the Last Year: Never true    Ran Out of Food in the Last Year: Never true  Transportation Needs: No Transportation Needs (06/14/2020)   PRAPARE - Administrator, Civil Service (Medical): No    Lack of Transportation (Non-Medical): No  Physical Activity: Sufficiently Active (06/14/2020)   Exercise Vital Sign    Days of Exercise per Week: 3 days    Minutes of Exercise per Session: 60 min  Stress: No Stress Concern Present (06/14/2020)  [employer] of Occupational Health - Occupational Stress Questionnaire    Feeling of Stress : Not at all  Social Connections: Socially Integrated (06/14/2020)   Social Connection and Isolation Panel [NHANES]    Frequency of Communication with Friends and Family: More than three times a week    Frequency of Social Gatherings with Friends and Family: More than three times a week    Attends Religious Services: More than 4 times per year    Active Member of [employer] or Organizations: No    Attends Engineer, structural: More than 4 times per year    Marital Status: Married  Catering manager Violence: Not At Risk (06/14/2020)   Humiliation, Afraid, Rape, and Kick questionnaire    Fear of  Current or Ex-Partner: No    Emotionally Abused: No    Physically Abused: No    Sexually Abused: No    Outpatient Medications Prior to Visit  Medication Sig Dispense Refill   blood glucose meter kit and supplies KIT Dispense based on patient and insurance preference. Use up to four times daily as directed. 1 each 0   doxycycline (VIBRAMYCIN) 100 MG capsule Take 100 mg by mouth 2 (two) times daily.     glipiZIDE (GLUCOTROL) 5 MG tablet Take 1 tablet (5 mg total) by mouth 2 (two) times daily before a meal. 60 tablet 3   ipratropium (ATROVENT) 0.03 % nasal spray Place 2 sprays into both nostrils 2 (two) times daily as needed for rhinitis. 30 mL 0   loratadine (CLARITIN) 10 MG tablet Take 10 mg by mouth daily. As needed     metFORMIN (GLUCOPHAGE-XR) 500 MG 24 hr tablet Take 1 tablet (500 mg total) by mouth 2 (two) times daily with a meal. 60 tablet 5   pantoprazole (PROTONIX) 40 MG tablet Take 1 tablet (40 mg total) by mouth daily. 30 tablet 5   rosuvastatin (CRESTOR) 10 MG tablet Take 1 tablet (10 mg total) by mouth daily. 90 tablet 3   telmisartan-hydrochlorothiazide (MICARDIS HCT) 80-12.5 MG tablet Take 1 tablet by mouth daily. 90 tablet 1   No facility-administered medications prior to visit.    No Known Allergies  ROS Review of Systems  Constitutional:  Negative for chills and fever.  HENT:  Negative for congestion, sinus pressure, sinus pain and sore throat.   Eyes:  Negative for pain and discharge.  Respiratory:  Negative for cough and shortness of breath.   Cardiovascular:  Negative for chest pain and palpitations.  Gastrointestinal:  Negative for abdominal pain, diarrhea, nausea and vomiting.  Endocrine: Negative for polydipsia and polyuria.  Genitourinary:  Negative for dysuria, hematuria, vaginal discharge and vaginal pain.  Musculoskeletal:  Negative for neck pain and neck stiffness.  Skin:  Negative for rash.  Neurological:  Negative for dizziness and weakness.   Psychiatric/Behavioral:  Negative for agitation and behavioral problems.       Objective:    Physical Exam Vitals reviewed.  Constitutional:      General: She is not in acute distress.    Appearance: She is obese. She is not diaphoretic.  HENT:     Head: Normocephalic and atraumatic.     Nose: Nose normal.     Mouth/Throat:     Mouth: Mucous membranes are moist.  Eyes:     General: No scleral icterus.    Extraocular Movements: Extraocular movements intact.  Cardiovascular:     Rate and Rhythm: Normal rate and regular rhythm.     Pulses: Normal  pulses.     Heart sounds: Normal heart sounds. No murmur heard. Pulmonary:     Breath sounds: Normal breath sounds. No wheezing or rales.  Musculoskeletal:     Cervical back: Neck supple. No tenderness.     Right lower leg: No edema.     Left lower leg: No edema.  Skin:    General: Skin is warm.     Findings: No rash.  Neurological:     General: No focal deficit present.     Mental Status: She is alert and oriented to person, place, and time.     Sensory: No sensory deficit.     Motor: No weakness.  Psychiatric:        Mood and Affect: Mood normal.        Behavior: Behavior normal.     BP 114/79   Pulse (!) 103   Ht 5\' 2"  (1.575 m)   Wt 197 lb 3.2 oz (89.4 kg)   SpO2 96%   BMI 36.07 kg/m  Wt Readings from Last 3 Encounters:  06/09/23 197 lb 3.2 oz (89.4 kg)  05/04/23 192 lb 6.4 oz (87.3 kg)  02/01/23 196 lb 9.6 oz (89.2 kg)    Lab Results  Component Value Date   TSH 1.800 02/01/2023   Lab Results  Component Value Date   WBC 8.5 02/01/2023   HGB 12.5 02/01/2023   HCT 36.9 02/01/2023   MCV 95 02/01/2023   PLT 334 02/01/2023   Lab Results  Component Value Date   NA 131 (L) 02/01/2023   K 4.2 02/01/2023   CO2 21 02/01/2023   GLUCOSE 224 (H) 02/01/2023   BUN 10 02/01/2023   CREATININE 1.06 (H) 02/01/2023   BILITOT 0.9 02/01/2023   ALKPHOS 66 02/01/2023   AST 29 02/01/2023   ALT 36 (H) 02/01/2023    PROT 7.5 02/01/2023   ALBUMIN 4.5 02/01/2023   CALCIUM 9.2 02/01/2023   EGFR 69 02/01/2023   Lab Results  Component Value Date   CHOL 126 02/01/2023   Lab Results  Component Value Date   HDL 23 (L) 02/01/2023   Lab Results  Component Value Date   LDLCALC Comment (A) 02/01/2023   Lab Results  Component Value Date   TRIG 850 (HH) 02/01/2023   Lab Results  Component Value Date   CHOLHDL 5.5 (H) 02/01/2023   Lab Results  Component Value Date   HGBA1C 8.7 (H) 02/01/2023      Assessment & Plan:   Problem List Items Addressed This Visit       Cardiovascular and Mediastinum   Primary hypertension   BP Readings from Last 1 Encounters:  06/09/23 114/79   Well-controlled with  telmisartan-HCTZ 80-12.5 mg daily HTN also likely due to alcohol dependence, needs to cut down Counseled for compliance with the medications Advised DASH diet and moderate exercise/walking, at least 150 mins/week        Endocrine   Type 2 diabetes mellitus with other specified complication (HCC) - Primary   Lab Results  Component Value Date   HGBA1C 8.7 (H) 02/01/2023   Associated with HTN and HLD Uncontrolled, worse recently due to noncompliance to diet and alcohol use On Metformin 500 mg BID and glipizide 5 mg twice daily before meals recently - advised to take it before breakfast and dinnertime regularly and avoid skipping meals  Home blood glucose have improved to 80s-150s 200s after starting Glipizide  Advised to check blood glucose at least twice daily and contact  if blood glucose more than 300 or less than 70  If persistent hyperglycemia, will need insulin  Not a candidate for incretin analog due to history of pancreatitis Advised to follow diabetic diet On statin and ARB F/u CMP and lipid panel Diabetic eye exam: Advised to follow up with Ophthalmology for diabetic eye exam      Relevant Orders   Microalbumin / creatinine urine ratio   CMP14+EGFR   Hemoglobin A1c    Ambulatory referral to Ophthalmology     Other   Hyperlipidemia   Check lipid profile On Crestor -compliance is questionable Advised to take omega-3 Was not able to get Vascepa due to insurance coverage concern      Relevant Orders   Lipid Profile   Alcohol dependence (HCC)   Used to take 3-5 drinks of vodka every day, has been cutting down H/o recurrent pancreatitis, f/u CMP Advised to quit alcohol use - support group information has been provided in the past      Other Visit Diagnoses       Diabetic eye exam [facility])       Relevant Orders   Ambulatory referral to Ophthalmology         No orders of the defined types were placed in this encounter.   Follow-up: Return in about 4 months (around 10/07/2023) for DM.    Anabel Halon, MD

## 2023-06-09 NOTE — Patient Instructions (Signed)
Please continue to take medications as prescribed.  Please continue to follow low carb diet and perform moderate exercise/walking at least 150 mins/week.

## 2023-06-09 NOTE — Assessment & Plan Note (Signed)
Lab Results  Component Value Date   HGBA1C 8.7 (H) 02/01/2023   Associated with HTN and HLD Uncontrolled, worse recently due to noncompliance to diet and alcohol use On Metformin 500 mg BID and glipizide 5 mg twice daily before meals recently - advised to take it before breakfast and dinnertime regularly and avoid skipping meals  Home blood glucose have improved to 80s-150s 200s after starting Glipizide  Advised to check blood glucose at least twice daily and contact if blood glucose more than 300 or less than 70  If persistent hyperglycemia, will need insulin  Not a candidate for incretin analog due to history of pancreatitis Advised to follow diabetic diet On statin and ARB F/u CMP and lipid panel Diabetic eye exam: Advised to follow up with Ophthalmology for diabetic eye exam

## 2023-06-09 NOTE — Assessment & Plan Note (Addendum)
Used to take 3-5 drinks of vodka every day, has been cutting down H/o recurrent pancreatitis, f/u CMP Advised to quit alcohol use - support group information has been provided in the past

## 2023-06-09 NOTE — Assessment & Plan Note (Signed)
Check lipid profile On Crestor -compliance is questionable Advised to take omega-3 Was not able to get Vascepa due to insurance coverage concern

## 2023-06-09 NOTE — Assessment & Plan Note (Signed)
BP Readings from Last 1 Encounters:  06/09/23 114/79   Well-controlled with  telmisartan-HCTZ 80-12.5 mg daily HTN also likely due to alcohol dependence, needs to cut down Counseled for compliance with the medications Advised DASH diet and moderate exercise/walking, at least 150 mins/week

## 2023-06-10 LAB — CMP14+EGFR
ALT: 19 [IU]/L (ref 0–32)
AST: 22 [IU]/L (ref 0–40)
Albumin: 4.4 g/dL (ref 3.9–4.9)
Alkaline Phosphatase: 67 [IU]/L (ref 44–121)
BUN/Creatinine Ratio: 15 (ref 9–23)
BUN: 13 mg/dL (ref 6–24)
Bilirubin Total: 0.3 mg/dL (ref 0.0–1.2)
CO2: 23 mmol/L (ref 20–29)
Calcium: 9.9 mg/dL (ref 8.7–10.2)
Chloride: 102 mmol/L (ref 96–106)
Creatinine, Ser: 0.84 mg/dL (ref 0.57–1.00)
Globulin, Total: 2.7 g/dL (ref 1.5–4.5)
Glucose: 97 mg/dL (ref 70–99)
Potassium: 4.2 mmol/L (ref 3.5–5.2)
Sodium: 142 mmol/L (ref 134–144)
Total Protein: 7.1 g/dL (ref 6.0–8.5)
eGFR: 90 mL/min/{1.73_m2} (ref >59)

## 2023-06-10 LAB — HEMOGLOBIN A1C
Est. average glucose Bld gHb Est-mCnc: 240 mg/dL
Hgb A1c MFr Bld: 10 % — ABNORMAL HIGH (ref 4.8–5.6)

## 2023-06-10 LAB — LIPID PANEL
Chol/HDL Ratio: 3.1 {ratio} (ref 0.0–4.4)
Cholesterol, Total: 131 mg/dL (ref 100–199)
HDL: 42 mg/dL (ref >39)
LDL Chol Calc (NIH): 36 mg/dL (ref 0–99)
Triglycerides: 362 mg/dL — ABNORMAL HIGH (ref 0–149)
VLDL Cholesterol Cal: 53 mg/dL — ABNORMAL HIGH (ref 5–40)

## 2023-06-10 LAB — MICROALBUMIN / CREATININE URINE RATIO
Creatinine, Urine: 129.2 mg/dL
Microalb/Creat Ratio: 21 mg/g{creat} (ref 0–29)
Microalbumin, Urine: 27.5 ug/mL

## 2023-06-13 ENCOUNTER — Other Ambulatory Visit: Payer: Self-pay | Admitting: Internal Medicine

## 2023-06-13 DIAGNOSIS — E1169 Type 2 diabetes mellitus with other specified complication: Secondary | ICD-10-CM

## 2023-06-15 ENCOUNTER — Telehealth: Payer: Self-pay | Admitting: Internal Medicine

## 2023-06-15 ENCOUNTER — Other Ambulatory Visit: Payer: Self-pay | Admitting: Internal Medicine

## 2023-06-15 DIAGNOSIS — E1169 Type 2 diabetes mellitus with other specified complication: Secondary | ICD-10-CM

## 2023-06-15 NOTE — Addendum Note (Signed)
 Addended byTrena Platt on: 06/15/2023 05:12 PM   Modules accepted: Orders

## 2023-06-15 NOTE — Telephone Encounter (Unsigned)
 Copied from CRM [id number]. Topic: Referral - Request for Referral >> Jun 15, 2023  1:09 PM Mosetta Putt H wrote: Did the patient discuss referral with their provider in the last year? yes (If No - schedule appointment) (If Yes - send message)  Appointment offered? No  Type of order/referral and detailed reason for visit: Nutrition counseling  Preference of office, provider, location: [facility] fax [phone] Office address [address] [place] [address]  If referral order, have you been seen by this specialty before? No (If Yes, this issue or another issue? When? Where?  Can we respond through MyChart? Yes

## 2023-06-16 NOTE — Telephone Encounter (Signed)
 Left detailed vm

## 2023-07-26 ENCOUNTER — Ambulatory Visit: Payer: 59 | Admitting: Nutrition

## 2023-08-22 ENCOUNTER — Other Ambulatory Visit: Payer: Self-pay | Admitting: Internal Medicine

## 2023-08-22 DIAGNOSIS — E1169 Type 2 diabetes mellitus with other specified complication: Secondary | ICD-10-CM

## 2023-08-22 DIAGNOSIS — I10 Essential (primary) hypertension: Secondary | ICD-10-CM

## 2023-09-22 ENCOUNTER — Other Ambulatory Visit: Payer: Self-pay | Admitting: Internal Medicine

## 2023-09-22 DIAGNOSIS — E1169 Type 2 diabetes mellitus with other specified complication: Secondary | ICD-10-CM

## 2023-10-07 ENCOUNTER — Ambulatory Visit: Payer: 59 | Admitting: Internal Medicine

## 2023-10-21 ENCOUNTER — Other Ambulatory Visit: Payer: Self-pay | Admitting: Internal Medicine

## 2023-10-21 DIAGNOSIS — E1169 Type 2 diabetes mellitus with other specified complication: Secondary | ICD-10-CM

## 2023-11-03 ENCOUNTER — Encounter: Payer: Self-pay | Admitting: Internal Medicine

## 2023-11-03 ENCOUNTER — Ambulatory Visit: Payer: Self-pay | Admitting: Internal Medicine

## 2023-11-03 VITALS — BP 118/74 | HR 96 | Ht 62.0 in | Wt 195.0 lb

## 2023-11-03 DIAGNOSIS — Z7984 Long term (current) use of oral hypoglycemic drugs: Secondary | ICD-10-CM

## 2023-11-03 DIAGNOSIS — E1169 Type 2 diabetes mellitus with other specified complication: Secondary | ICD-10-CM

## 2023-11-03 DIAGNOSIS — I10 Essential (primary) hypertension: Secondary | ICD-10-CM

## 2023-11-03 DIAGNOSIS — R002 Palpitations: Secondary | ICD-10-CM

## 2023-11-03 MED ORDER — METFORMIN HCL ER 500 MG PO TB24: 1000.0000 mg | ORAL_TABLET | Freq: Two times a day (BID) | ORAL | 1 refills | Status: AC

## 2023-11-03 NOTE — Progress Notes (Signed)
 Established Patient Office Visit  Subjective:  Patient ID: Tracy Ortiz, female    DOB: 03-Oct-1980  Age: 43 y.o. MRN: [id number]  CC:  Chief Complaint  Patient presents with   Medical Management of Chronic Issues    4 month f/u, has concerns about her heart rate racing at times.    HPI Tracy Ortiz is a 43 y.o. female with past medical history of hypertension, sarcoidosis, hyperlipidemia, alcohol and tobacco abuse who presents for f/u of her chronic medical conditions.  HTN: Her BP is wnl today.  She has been taking telmisartan -HCTZ  80-12.5 mg once daily. She denies any headache, dizziness, chest pain, dyspnea currently. She has intermittent palpitations with exertion.  Type II DM: Her HbA1c was 10.0 in 02/25.  She has been taking metformin  500 mg QD and glipizide  5 mg BID. Her blood glucose had been in 200s mostly before starting Glipizide , but has been better now - around 150s. She has chronic fatigue, but denies any polyuria or polyphagia.  Her alcohol intake has been worsening her glycemic profile, she understands that and has tried to avoid it for the last 6 months.  HLD: She takes Crestor , but could not take Vascepa  as her insurance did not cover it.  Recurrent pancreatitis: She has had multiple episodes of pancreatitis, likely due to alcohol dependence.  She has been trying to avoid alcohol since the last visit, but admits that she had few drinks in the last week. She acknowledges that she needs to cut down.  She currently denies any abdominal pain, nausea or vomiting. She has had GI evaluation in the past, but did not follow-up after initial consultation.      Past Medical History:  Diagnosis Date   HPV (human papilloma virus) infection    Hyperlipidemia    Hypertension    Pancreatitis     History reviewed. No pertinent surgical history.  Family History  Problem Relation Age of Onset   Other Father        healthy   Diabetes Mother    Polycystic ovary syndrome  Sister     Social History   Socioeconomic History   Marital status: Married    Spouse name: Not on file   Number of children: Not on file   Years of education: Not on file   Highest education level: Not on file  Occupational History   Not on file  Tobacco Use   Smoking status: Every Day   Smokeless tobacco: Never  Substance and Sexual Activity   Alcohol use: Yes    Comment: wine daily   Drug use: Never   Sexual activity: Yes    Birth control/protection: Condom  Other Topics Concern   Not on file  Social History Narrative   Not on file   Social Drivers of Health   Financial Resource Strain: Low Risk  (06/14/2020)   Overall Financial Resource Strain (CARDIA)    Difficulty of Paying Living Expenses: Not hard at all  Food Insecurity: No Food Insecurity (06/14/2020)   Hunger Vital Sign    Worried About Running Out of Food in the Last Year: Never true    Ran Out of Food in the Last Year: Never true  Transportation Needs: No Transportation Needs (06/14/2020)   PRAPARE - Administrator, Civil Service (Medical): No    Lack of Transportation (Non-Medical): No  Physical Activity: Sufficiently Active (06/14/2020)   Exercise Vital Sign    Days of Exercise per Week: 3 days  Minutes of Exercise per Session: 60 min  Stress: No Stress Concern Present (06/14/2020)   [employer] of Occupational Health - Occupational Stress Questionnaire    Feeling of Stress : Not at all  Social Connections: Socially Integrated (06/14/2020)   Social Connection and Isolation Panel    Frequency of Communication with Friends and Family: More than three times a week    Frequency of Social Gatherings with Friends and Family: More than three times a week    Attends Religious Services: More than 4 times per year    Active Member of [employer] or Organizations: No    Attends Engineer, structural: More than 4 times per year    Marital Status: Married  Catering manager Violence: Not At Risk  (06/14/2020)   Humiliation, Afraid, Rape, and Kick questionnaire    Fear of Current or Ex-Partner: No    Emotionally Abused: No    Physically Abused: No    Sexually Abused: No    Outpatient Medications Prior to Visit  Medication Sig Dispense Refill   blood glucose meter kit and supplies KIT Dispense based on patient and insurance preference. Use up to four times daily as directed. 1 each 0   doxycycline (VIBRAMYCIN) 100 MG capsule Take 100 mg by mouth 2 (two) times daily.     glipiZIDE  (GLUCOTROL ) 5 MG tablet TAKE 1 TABLET BY MOUTH TWICE DAILY BEFORE A MEAL 60 tablet 0   ipratropium (ATROVENT ) 0.03 % nasal spray Place 2 sprays into both nostrils 2 (two) times daily as needed for rhinitis. 30 mL 0   loratadine (CLARITIN) 10 MG tablet Take 10 mg by mouth daily. As needed     pantoprazole  (PROTONIX ) 40 MG tablet Take 1 tablet (40 mg total) by mouth daily. 30 tablet 5   rosuvastatin  (CRESTOR ) 10 MG tablet Take 1 tablet (10 mg total) by mouth daily. 90 tablet 3   telmisartan -hydrochlorothiazide (MICARDIS  HCT) 80-12.5 MG tablet Take 1 tablet by mouth once daily 90 tablet 0   metFORMIN  (GLUCOPHAGE -XR) 500 MG 24 hr tablet Take 1 tablet by mouth once daily with breakfast 30 tablet 0   No facility-administered medications prior to visit.    No Known Allergies  ROS Review of Systems  Constitutional:  Negative for chills and fever.  HENT:  Negative for congestion, sinus pressure, sinus pain and sore throat.   Eyes:  Negative for pain and discharge.  Respiratory:  Negative for cough and shortness of breath.   Cardiovascular:  Positive for palpitations. Negative for chest pain.  Gastrointestinal:  Negative for abdominal pain, diarrhea, nausea and vomiting.  Endocrine: Negative for polydipsia and polyuria.  Genitourinary:  Negative for dysuria, hematuria, vaginal discharge and vaginal pain.  Musculoskeletal:  Negative for neck pain and neck stiffness.  Skin:  Negative for rash.  Neurological:   Negative for dizziness and weakness.  Psychiatric/Behavioral:  Negative for agitation and behavioral problems.       Objective:    Physical Exam Vitals reviewed.  Constitutional:      General: She is not in acute distress.    Appearance: She is obese. She is not diaphoretic.  HENT:     Head: Normocephalic and atraumatic.     Nose: Nose normal.     Mouth/Throat:     Mouth: Mucous membranes are moist.  Eyes:     General: No scleral icterus.    Extraocular Movements: Extraocular movements intact.  Cardiovascular:     Rate and Rhythm: Normal rate and regular rhythm.  Heart sounds: Normal heart sounds. No murmur heard. Pulmonary:     Breath sounds: Normal breath sounds. No wheezing or rales.  Musculoskeletal:     Cervical back: Neck supple. No tenderness.     Right lower leg: No edema.     Left lower leg: No edema.  Skin:    General: Skin is warm.     Findings: No rash.  Neurological:     General: No focal deficit present.     Mental Status: She is alert and oriented to person, place, and time.     Sensory: No sensory deficit.     Motor: No weakness.  Psychiatric:        Mood and Affect: Mood normal.        Behavior: Behavior normal.     BP 118/74   Pulse 96   Ht 5' 2 (1.575 m)   Wt 195 lb (88.5 kg)   SpO2 97%   BMI 35.67 kg/m  Wt Readings from Last 3 Encounters:  11/03/23 195 lb (88.5 kg)  06/09/23 197 lb 3.2 oz (89.4 kg)  05/04/23 192 lb 6.4 oz (87.3 kg)    Lab Results  Component Value Date   TSH 1.800 02/01/2023   Lab Results  Component Value Date   WBC 8.5 02/01/2023   HGB 12.5 02/01/2023   HCT 36.9 02/01/2023   MCV 95 02/01/2023   PLT 334 02/01/2023   Lab Results  Component Value Date   NA 142 06/09/2023   K 4.2 06/09/2023   CO2 23 06/09/2023   GLUCOSE 97 06/09/2023   BUN 13 06/09/2023   CREATININE 0.84 06/09/2023   BILITOT 0.3 06/09/2023   ALKPHOS 67 06/09/2023   AST 22 06/09/2023   ALT 19 06/09/2023   PROT 7.1 06/09/2023   ALBUMIN  4.4 06/09/2023   CALCIUM  9.9 06/09/2023   EGFR 90 06/09/2023   Lab Results  Component Value Date   CHOL 131 06/09/2023   Lab Results  Component Value Date   HDL 42 06/09/2023   Lab Results  Component Value Date   LDLCALC 36 06/09/2023   Lab Results  Component Value Date   TRIG 362 (H) 06/09/2023   Lab Results  Component Value Date   CHOLHDL 3.1 06/09/2023   Lab Results  Component Value Date   HGBA1C 10.0 (H) 06/09/2023      Assessment & Plan:   Problem List Items Addressed This Visit       Cardiovascular and Mediastinum   Primary hypertension   BP Readings from Last 1 Encounters:  11/03/23 118/74   Well-controlled with  telmisartan -HCTZ 80-12.5 mg daily HTN also likely due to alcohol dependence, needs to cut down Counseled for compliance with the medications Advised DASH diet and moderate exercise/walking, at least 150 mins/week        Endocrine   Type 2 diabetes mellitus with other specified complication (HCC) - Primary   Lab Results  Component Value Date   HGBA1C 10.0 (H) 06/09/2023   Associated with HTN and HLD Uncontrolled, worse recently due to noncompliance to diet and alcohol use On Metformin  500 mg QD and glipizide  5 mg twice daily before meals recently - advised to take it before breakfast and dinnertime regularly and avoid skipping meals Increased Metformin  to 1000 mg BID  Home blood glucose have improved to 150s after starting Glipizide   Advised to check blood glucose at least twice daily and contact if blood glucose more than 300 or less than 70  If  persistent hyperglycemia, will need insulin  Not a candidate for incretin analog due to history of pancreatitis Advised to follow diabetic diet On statin and ARB F/u CMP and lipid panel Diabetic eye exam: Advised to follow up with Ophthalmology for diabetic eye exam      Relevant Medications   metFORMIN  (GLUCOPHAGE -XR) 500 MG 24 hr tablet   Other Relevant Orders   CMP14+EGFR    Hemoglobin A1c     Other   Palpitations   Likely related to alcohol intake/withdrawal Needs to cut down -> quit alcohol use (has had recurrent pancreatitis)          Meds ordered this encounter  Medications   metFORMIN  (GLUCOPHAGE -XR) 500 MG 24 hr tablet    Sig: Take 2 tablets (1,000 mg total) by mouth 2 (two) times daily with a meal.    Dispense:  360 tablet    Refill:  1    Follow-up: Return in about 3 months (around 02/03/2024) for DM.    Suzzane MARLA Blanch, MD

## 2023-11-03 NOTE — Assessment & Plan Note (Signed)
 Likely related to alcohol intake/withdrawal Needs to cut down -> quit alcohol use (has had recurrent pancreatitis)

## 2023-11-03 NOTE — Assessment & Plan Note (Addendum)
 Lab Results  Component Value Date   HGBA1C 10.0 (H) 06/09/2023   Associated with HTN and HLD Uncontrolled, worse recently due to noncompliance to diet and alcohol use On Metformin  500 mg QD and glipizide  5 mg twice daily before meals recently - advised to take it before breakfast and dinnertime regularly and avoid skipping meals Increased Metformin  to 1000 mg BID  Home blood glucose have improved to 150s after starting Glipizide   Advised to check blood glucose at least twice daily and contact if blood glucose more than 300 or less than 70  If persistent hyperglycemia, will need insulin  Not a candidate for incretin analog due to history of pancreatitis Advised to follow diabetic diet On statin and ARB F/u CMP and lipid panel Diabetic eye exam: Advised to follow up with Ophthalmology for diabetic eye exam

## 2023-11-03 NOTE — Assessment & Plan Note (Signed)
 BP Readings from Last 1 Encounters:  11/03/23 118/74   Well-controlled with  telmisartan -HCTZ 80-12.5 mg daily HTN also likely due to alcohol dependence, needs to cut down Counseled for compliance with the medications Advised DASH diet and moderate exercise/walking, at least 150 mins/week

## 2023-11-03 NOTE — Patient Instructions (Signed)
 Please start taking Metformin  1000 mg twice daily. Please continue taking Glipizide  as prescribed.  Please continue to take medications as prescribed.  Please continue to follow low carb diet and perform moderate exercise/walking at least 150 mins/week.  Please get fasting blood tests done within a week.

## 2023-11-10 ENCOUNTER — Ambulatory Visit: Payer: Self-pay | Admitting: Internal Medicine

## 2023-11-11 LAB — CMP14+EGFR
ALT: 14 IU/L (ref 0–32)
AST: 21 IU/L (ref 0–40)
Albumin: 4.7 g/dL (ref 3.9–4.9)
Alkaline Phosphatase: 65 IU/L (ref 44–121)
BUN/Creatinine Ratio: 13 (ref 9–23)
BUN: 20 mg/dL (ref 6–24)
Bilirubin Total: 0.2 mg/dL (ref 0.0–1.2)
CO2: 18 mmol/L — AB (ref 20–29)
Calcium: 10.5 mg/dL — AB (ref 8.7–10.2)
Chloride: 96 mmol/L (ref 96–106)
Creatinine, Ser: 1.52 mg/dL — AB (ref 0.57–1.00)
Globulin, Total: 2.5 g/dL (ref 1.5–4.5)
Glucose: 140 mg/dL — AB (ref 70–99)
Potassium: 4.3 mmol/L (ref 3.5–5.2)
Sodium: 137 mmol/L (ref 134–144)
Total Protein: 7.2 g/dL (ref 6.0–8.5)
eGFR: 44 mL/min/1.73 — AB (ref >59)

## 2023-11-11 LAB — HEMOGLOBIN A1C
Est. average glucose Bld gHb Est-mCnc: 137 mg/dL
Hgb A1c MFr Bld: 6.4 % — ABNORMAL HIGH (ref 4.8–5.6)

## 2023-11-19 ENCOUNTER — Other Ambulatory Visit: Payer: Self-pay | Admitting: Internal Medicine

## 2023-11-19 DIAGNOSIS — I10 Essential (primary) hypertension: Secondary | ICD-10-CM

## 2023-11-19 DIAGNOSIS — E1169 Type 2 diabetes mellitus with other specified complication: Secondary | ICD-10-CM

## 2023-11-19 DIAGNOSIS — K219 Gastro-esophageal reflux disease without esophagitis: Secondary | ICD-10-CM

## 2024-01-17 DIAGNOSIS — K859 Acute pancreatitis without necrosis or infection, unspecified: Secondary | ICD-10-CM | POA: Diagnosis not present

## 2024-01-17 DIAGNOSIS — R160 Hepatomegaly, not elsewhere classified: Secondary | ICD-10-CM | POA: Diagnosis not present

## 2024-01-17 DIAGNOSIS — R112 Nausea with vomiting, unspecified: Secondary | ICD-10-CM | POA: Diagnosis not present

## 2024-01-17 DIAGNOSIS — N2889 Other specified disorders of kidney and ureter: Secondary | ICD-10-CM | POA: Diagnosis not present

## 2024-01-17 DIAGNOSIS — I10 Essential (primary) hypertension: Secondary | ICD-10-CM | POA: Diagnosis not present

## 2024-01-17 DIAGNOSIS — Z8639 Personal history of other endocrine, nutritional and metabolic disease: Secondary | ICD-10-CM | POA: Diagnosis not present

## 2024-01-17 DIAGNOSIS — K8689 Other specified diseases of pancreas: Secondary | ICD-10-CM | POA: Diagnosis not present

## 2024-01-17 DIAGNOSIS — R1013 Epigastric pain: Secondary | ICD-10-CM | POA: Diagnosis not present

## 2024-01-17 DIAGNOSIS — Z79899 Other long term (current) drug therapy: Secondary | ICD-10-CM | POA: Diagnosis not present

## 2024-01-17 DIAGNOSIS — E876 Hypokalemia: Secondary | ICD-10-CM | POA: Diagnosis not present

## 2024-01-17 DIAGNOSIS — R1010 Upper abdominal pain, unspecified: Secondary | ICD-10-CM | POA: Diagnosis not present

## 2024-01-17 DIAGNOSIS — K760 Fatty (change of) liver, not elsewhere classified: Secondary | ICD-10-CM | POA: Diagnosis not present

## 2024-01-17 DIAGNOSIS — E785 Hyperlipidemia, unspecified: Secondary | ICD-10-CM | POA: Diagnosis not present

## 2024-01-17 DIAGNOSIS — D649 Anemia, unspecified: Secondary | ICD-10-CM | POA: Diagnosis not present

## 2024-01-18 DIAGNOSIS — R935 Abnormal findings on diagnostic imaging of other abdominal regions, including retroperitoneum: Secondary | ICD-10-CM | POA: Diagnosis not present

## 2024-01-18 LAB — HEMOGLOBIN A1C: Hemoglobin A1C: 6

## 2024-01-27 ENCOUNTER — Ambulatory Visit (INDEPENDENT_AMBULATORY_CARE_PROVIDER_SITE_OTHER): Admitting: Internal Medicine

## 2024-01-27 ENCOUNTER — Encounter: Payer: Self-pay | Admitting: Internal Medicine

## 2024-01-27 VITALS — BP 111/80 | HR 106 | Ht 62.0 in | Wt 193.8 lb

## 2024-01-27 DIAGNOSIS — Z09 Encounter for follow-up examination after completed treatment for conditions other than malignant neoplasm: Secondary | ICD-10-CM

## 2024-01-27 DIAGNOSIS — K861 Other chronic pancreatitis: Secondary | ICD-10-CM

## 2024-01-27 DIAGNOSIS — Z1231 Encounter for screening mammogram for malignant neoplasm of breast: Secondary | ICD-10-CM

## 2024-01-27 DIAGNOSIS — E1169 Type 2 diabetes mellitus with other specified complication: Secondary | ICD-10-CM

## 2024-01-27 DIAGNOSIS — D649 Anemia, unspecified: Secondary | ICD-10-CM | POA: Diagnosis not present

## 2024-01-27 DIAGNOSIS — I10 Essential (primary) hypertension: Secondary | ICD-10-CM | POA: Diagnosis not present

## 2024-01-27 DIAGNOSIS — E876 Hypokalemia: Secondary | ICD-10-CM | POA: Diagnosis not present

## 2024-01-27 DIAGNOSIS — K219 Gastro-esophageal reflux disease without esophagitis: Secondary | ICD-10-CM | POA: Diagnosis not present

## 2024-01-27 DIAGNOSIS — Z7984 Long term (current) use of oral hypoglycemic drugs: Secondary | ICD-10-CM | POA: Diagnosis not present

## 2024-01-27 DIAGNOSIS — F10288 Alcohol dependence with other alcohol-induced disorder: Secondary | ICD-10-CM

## 2024-01-27 MED ORDER — PANTOPRAZOLE SODIUM 40 MG PO TBEC: 40.0000 mg | DELAYED_RELEASE_TABLET | Freq: Every day | ORAL | 1 refills | Status: DC

## 2024-01-27 NOTE — Patient Instructions (Signed)
 Please continue to take medications as prescribed.  Please continue to follow low carb diet and perform moderate exercise/walking as tolerated.  Please take potassium rich food, such as avocado, tomato, spinach, etc.

## 2024-01-27 NOTE — Assessment & Plan Note (Signed)
 Lab Results  Component Value Date   HGBA1C 6.0 01/18/2024   Associated with HTN and HLD Well-controlled On Metformin  1000 mg BID and glipizide  5 mg BID before meals recently - advised to take it before breakfast and dinnertime regularly and avoid skipping meals  Advised to check blood glucose at least twice daily and contact if blood glucose more than 300 or less than 70  If persistent hyperglycemia, will need insulin  Not a candidate for incretin analog due to history of pancreatitis Advised to follow diabetic diet On statin and ARB F/u CMP and lipid panel Diabetic eye exam: Advised to follow up with Ophthalmology for diabetic eye exam

## 2024-01-27 NOTE — Progress Notes (Signed)
 Established Patient Office Visit  Subjective:  Patient ID: Tracy Ortiz, female    DOB: 1981/02/01  Age: 43 y.o. MRN: [id number]  CC:  Chief Complaint  Patient presents with   Follow-up    F/u states potasium levels were low.     HPI Tracy Ortiz is a 43 y.o. female with past medical history of hypertension, sarcoidosis, hyperlipidemia, alcohol and tobacco abuse who presents for f/u of chronic medical conditions and her recent hospitalization.  She was admitted on 01/17/2024 for evaluation and management of severe epigastric abdominal pain, nausea, and vomiting. CT imaging demonstrated acute interstitial pancreatitis without evidence of necrosis or infection. She was managed with NPO status, IV fluids, pain control, and antiemetics. RUQ ultrasound showed hepatomegaly and hepatic steatosis but no cholelithiasis or acute cholecystitis. She received counseling regarding alcohol cessation and was started on thiamine and multivitamins. A GI referral was placed for follow-up of recurrent pancreatitis.   HTN: Her BP is wnl today.  She has been taking telmisartan -HCTZ  80-12.5 mg once daily. She denies any headache, dizziness, chest pain, dyspnea currently. She has intermittent palpitations with exertion.   Type II DM: Her HbA1c was 6.0 in 09/25.  She has been taking metformin  1000 mg QD and glipizide  5 mg BID. Her blood glucose had been in 200s mostly before starting Glipizide , but has been better now - around 120s. She has chronic fatigue, but denies any polyuria or polyphagia.  Her alcohol intake has been worsening her glycemic profile, she understands that and has tried to avoid it.  HLD: She takes Crestor , but could not take Vascepa  as her insurance did not cover it.  Recurrent pancreatitis: She has had multiple episodes of pancreatitis, likely due to alcohol dependence.  She has been trying to avoid alcohol since the last visit, but admits that she had few drinks in the last week. She  acknowledges that she needs to cut down.  She currently denies any abdominal pain, nausea or vomiting. She has had GI evaluation in the past, but did not follow-up after initial consultation.      Past Medical History:  Diagnosis Date   HPV (human papilloma virus) infection    Hyperlipidemia    Hypertension    Pancreatitis     History reviewed. No pertinent surgical history.  Family History  Problem Relation Age of Onset   Other Father        healthy   Diabetes Mother    Polycystic ovary syndrome Sister     Social History   Socioeconomic History   Marital status: Married    Spouse name: Not on file   Number of children: Not on file   Years of education: Not on file   Highest education level: Not on file  Occupational History   Not on file  Tobacco Use   Smoking status: Every Day   Smokeless tobacco: Never  Substance and Sexual Activity   Alcohol use: Yes    Comment: wine daily   Drug use: Never   Sexual activity: Yes    Birth control/protection: Condom  Other Topics Concern   Not on file  Social History Narrative   Not on file   Social Drivers of Health   Financial Resource Strain: Low Risk  (01/17/2024)   Received from [facility]   Overall Financial Resource Strain (CARDIA)    How hard is it for you to pay for the very basics like food, housing, medical care, and heating?: Not very hard  Food Insecurity: No Food Insecurity (01/17/2024)   Received from [facility]   Hunger Vital Sign    Within the past 12 months, you worried that your food would run out before you got the money to buy more.: Never true    Within the past 12 months, the food you bought just didn't last and you didn't have money to get more.: Never true  Transportation Needs: No Transportation Needs (01/17/2024)   Received from [facility] - Transportation    Lack of Transportation (Medical): No    Lack of Transportation (Non-Medical): No  Physical Activity:  Insufficiently Active (01/17/2024)   Received from [facility]   Exercise Vital Sign    On average, how many days per week do you engage in moderate to strenuous exercise (like a brisk walk)?: 2 days    On average, how many minutes do you engage in exercise at this level?: 20 min  Stress: No Stress Concern Present (01/17/2024)   Received from [facility] - Occupational Stress Questionnaire    Do you feel stress - tense, restless, nervous, or anxious, or unable to sleep at night because your mind is troubled all the time - these days?: Not at all  Social Connections: Moderately Integrated (01/17/2024)   Received from [facility]   Social Connection and Isolation Panel    Frequency of Communication with Friends and Family: Not on file    How often do you get together with friends or relatives?: More than three times a week    How often do you attend church or religious services?: More than 4 times per year    Do you belong to any clubs or organizations such as church groups, unions, fraternal or athletic groups, or school groups?: No    How often do you attend meetings of the clubs or organizations you belong to?: Never    Are you married, widowed, divorced, separated, never married, or living with a partner?: Married  Intimate Partner Violence: Not At Risk (01/17/2024)   Received from [facility]   Humiliation, Afraid, Rape, and Kick questionnaire    Within the last year, have you been afraid of your partner or ex-partner?: No    Within the last year, have you been humiliated or emotionally abused in other ways by your partner or ex-partner?: No    Within the last year, have you been kicked, hit, slapped, or otherwise physically hurt by your partner or ex-partner?: No    Within the last year, have you been raped or forced to have any kind of sexual activity by your partner or ex-partner?: No    Outpatient Medications Prior to Visit   Medication Sig Dispense Refill   blood glucose meter kit and supplies KIT Dispense based on patient and insurance preference. Use up to four times daily as directed. 1 each 0   doxycycline (VIBRAMYCIN) 100 MG capsule Take 100 mg by mouth 2 (two) times daily.     glipiZIDE  (GLUCOTROL ) 5 MG tablet TAKE 1 TABLET BY MOUTH TWICE DAILY BEFORE A MEAL 60 tablet 0   ipratropium (ATROVENT ) 0.03 % nasal spray Place 2 sprays into both nostrils 2 (two) times daily as needed for rhinitis. 30 mL 0   loratadine (CLARITIN) 10 MG tablet Take 10 mg by mouth daily. As needed     metFORMIN  (GLUCOPHAGE -XR) 500 MG 24 hr tablet Take 2 tablets (1,000 mg total)  by mouth 2 (two) times daily with a meal. 360 tablet 1   rosuvastatin  (CRESTOR ) 10 MG tablet Take 1 tablet (10 mg total) by mouth daily. 90 tablet 3   telmisartan -hydrochlorothiazide (MICARDIS  HCT) 80-12.5 MG tablet Take 1 tablet by mouth once daily 90 tablet 0   pantoprazole  (PROTONIX ) 40 MG tablet Take 1 tablet by mouth once daily 30 tablet 0   No facility-administered medications prior to visit.    No Known Allergies  ROS Review of Systems  Constitutional:  Negative for chills and fever.  HENT:  Negative for congestion, sinus pressure, sinus pain and sore throat.   Eyes:  Negative for pain and discharge.  Respiratory:  Negative for cough and shortness of breath.   Cardiovascular:  Positive for palpitations. Negative for chest pain.  Gastrointestinal:  Negative for abdominal pain, diarrhea, nausea and vomiting.  Endocrine: Negative for polydipsia and polyuria.  Genitourinary:  Negative for dysuria, hematuria, vaginal discharge and vaginal pain.  Musculoskeletal:  Negative for neck pain and neck stiffness.  Skin:  Negative for rash.  Neurological:  Negative for dizziness and weakness.  Psychiatric/Behavioral:  Negative for agitation and behavioral problems.       Objective:    Physical Exam Vitals reviewed.  Constitutional:      General: She is  not in acute distress.    Appearance: She is obese. She is not diaphoretic.  HENT:     Head: Normocephalic and atraumatic.     Nose: Nose normal.     Mouth/Throat:     Mouth: Mucous membranes are moist.  Eyes:     General: No scleral icterus.    Extraocular Movements: Extraocular movements intact.  Cardiovascular:     Rate and Rhythm: Normal rate and regular rhythm.     Heart sounds: Normal heart sounds. No murmur heard. Pulmonary:     Breath sounds: Normal breath sounds. No wheezing or rales.  Musculoskeletal:     Cervical back: Neck supple. No tenderness.     Right lower leg: No edema.     Left lower leg: No edema.  Skin:    General: Skin is warm.     Findings: No rash.  Neurological:     General: No focal deficit present.     Mental Status: She is alert and oriented to person, place, and time.     Sensory: No sensory deficit.     Motor: No weakness.  Psychiatric:        Mood and Affect: Mood normal.        Behavior: Behavior normal.     BP 111/80   Pulse (!) 106   Ht 5' 2 (1.575 m)   Wt 193 lb 12.8 oz (87.9 kg)   SpO2 100%   BMI 35.45 kg/m  Wt Readings from Last 3 Encounters:  01/27/24 193 lb 12.8 oz (87.9 kg)  11/03/23 195 lb (88.5 kg)  06/09/23 197 lb 3.2 oz (89.4 kg)    Lab Results  Component Value Date   TSH 1.800 02/01/2023   Lab Results  Component Value Date   WBC 8.5 02/01/2023   HGB 12.5 02/01/2023   HCT 36.9 02/01/2023   MCV 95 02/01/2023   PLT 334 02/01/2023   Lab Results  Component Value Date   NA 137 11/09/2023   K 4.3 11/09/2023   CO2 18 (L) 11/09/2023   GLUCOSE 140 (H) 11/09/2023   BUN 20 11/09/2023   CREATININE 1.52 (H) 11/09/2023   BILITOT 0.2 11/09/2023  ALKPHOS 65 11/09/2023   AST 21 11/09/2023   ALT 14 11/09/2023   PROT 7.2 11/09/2023   ALBUMIN 4.7 11/09/2023   CALCIUM  10.5 (H) 11/09/2023   EGFR 44 (L) 11/09/2023   Lab Results  Component Value Date   CHOL 131 06/09/2023   Lab Results  Component Value Date   HDL  42 06/09/2023   Lab Results  Component Value Date   LDLCALC 36 06/09/2023   Lab Results  Component Value Date   TRIG 362 (H) 06/09/2023   Lab Results  Component Value Date   CHOLHDL 3.1 06/09/2023   Lab Results  Component Value Date   HGBA1C 6.0 01/18/2024      Assessment & Plan:   Problem List Items Addressed This Visit       Cardiovascular and Mediastinum   Primary hypertension   BP Readings from Last 1 Encounters:  01/27/24 111/80   Well-controlled with  telmisartan -HCTZ 80-12.5 mg daily HTN also likely due to alcohol dependence, needs to cut down Counseled for compliance with the medications Advised DASH diet and moderate exercise/walking, at least 150 mins/week        Digestive   Chronic recurrent pancreatitis (HCC) - Primary   Related to alcohol use and/or hypertriglyceridemia Has had hospitalizations for pancreatitis, advised to increase fluid intake and avoid alcohol use Needs to follow-up with GI for possible need for EUS      Relevant Medications   pantoprazole  (PROTONIX ) 40 MG tablet   Gastroesophageal reflux disease   Well-controlled with Protonix  40 mg QD, needs to take it regularly Can take Pepcid  in the evening as needed      Relevant Medications   pantoprazole  (PROTONIX ) 40 MG tablet     Endocrine   Type 2 diabetes mellitus with other specified complication (HCC)   Lab Results  Component Value Date   HGBA1C 6.0 01/18/2024   Associated with HTN and HLD Well-controlled On Metformin  1000 mg BID and glipizide  5 mg BID before meals recently - advised to take it before breakfast and dinnertime regularly and avoid skipping meals  Advised to check blood glucose at least twice daily and contact if blood glucose more than 300 or less than 70  If persistent hyperglycemia, will need insulin  Not a candidate for incretin analog due to history of pancreatitis Advised to follow diabetic diet On statin and ARB F/u CMP and lipid panel Diabetic  eye exam: Advised to follow up with Ophthalmology for diabetic eye exam        Other   Alcohol dependence (HCC)   Used to take 3-5 drinks of vodka every day, has been cutting down H/o recurrent pancreatitis Advised to quit alcohol use - support group information has been provided in the past Advised to take thiamine 100 mg QD and folic acid 500 mcg QD      Hospital discharge follow-up   Hospital chart reviewed, including discharge summary Medications reconciled and reviewed with the patient in detail      Relevant Orders   CBC with Differential/Platelet   Basic Metabolic Panel (BMET)   Hypokalemia   Severe hypokalemia likely due to vomiting from pancreatitis Was given IV potassium supplement during recent hospitalization Recheck BMP and magnesium level      Relevant Orders   Basic Metabolic Panel (BMET)   Magnesium   Normocytic anemia   CBC during recent hospitalization showed Hb around 8.7 Was 12.5 in 10/24 Has irregular heavy menstrual cycles, advised to follow-up with OB/GYN Due to  history of recurrent pancreatitis, advised to continue pantoprazole  40 mg QD and follow-up with GI Continue folic acid 500 mcg QD      Relevant Orders   CBC with Differential/Platelet   Other Visit Diagnoses       Breast cancer screening by mammogram       Relevant Orders   MM 3D SCREENING MAMMOGRAM BILATERAL BREAST          Meds ordered this encounter  Medications   pantoprazole  (PROTONIX ) 40 MG tablet    Sig: Take 1 tablet (40 mg total) by mouth daily.    Dispense:  90 tablet    Refill:  1    Follow-up: Return in about 3 months (around 04/28/2024) for DM.    Suzzane MARLA Blanch, MD

## 2024-01-27 NOTE — Assessment & Plan Note (Signed)
 BP Readings from Last 1 Encounters:  01/27/24 111/80   Well-controlled with  telmisartan -HCTZ 80-12.5 mg daily HTN also likely due to alcohol dependence, needs to cut down Counseled for compliance with the medications Advised DASH diet and moderate exercise/walking, at least 150 mins/week

## 2024-01-27 NOTE — Assessment & Plan Note (Addendum)
 Used to take 3-5 drinks of vodka every day, has been cutting down H/o recurrent pancreatitis Advised to quit alcohol use - support group information has been provided in the past Advised to take thiamine 100 mg QD and folic acid 500 mcg QD

## 2024-01-27 NOTE — Assessment & Plan Note (Signed)
 Well-controlled with Protonix  40 mg QD, needs to take it regularly Can take Pepcid  in the evening as needed

## 2024-01-27 NOTE — Assessment & Plan Note (Signed)
 Hospital chart reviewed, including discharge summary Medications reconciled and reviewed with the patient in detail

## 2024-01-27 NOTE — Assessment & Plan Note (Signed)
 CBC during recent hospitalization showed Hb around 8.7 Was 12.5 in 10/24 Has irregular heavy menstrual cycles, advised to follow-up with OB/GYN Due to history of recurrent pancreatitis, advised to continue pantoprazole  40 mg QD and follow-up with GI Continue folic acid 500 mcg QD

## 2024-01-27 NOTE — Assessment & Plan Note (Signed)
 Severe hypokalemia likely due to vomiting from pancreatitis Was given IV potassium supplement during recent hospitalization Recheck BMP and magnesium level

## 2024-01-27 NOTE — Assessment & Plan Note (Signed)
 Related to alcohol use and/or hypertriglyceridemia Has had hospitalizations for pancreatitis, advised to increase fluid intake and avoid alcohol use Needs to follow-up with GI for possible need for EUS

## 2024-01-28 ENCOUNTER — Ambulatory Visit: Payer: Self-pay | Admitting: Internal Medicine

## 2024-01-28 LAB — CBC WITH DIFFERENTIAL/PLATELET
Basophils Absolute: 0.1 x10E3/uL (ref 0.0–0.2)
Basos: 1 %
EOS (ABSOLUTE): 0.1 x10E3/uL (ref 0.0–0.4)
Eos: 2 %
Hematocrit: 29.7 % — ABNORMAL LOW (ref 34.0–46.6)
Hemoglobin: 9.6 g/dL — ABNORMAL LOW (ref 11.1–15.9)
Immature Grans (Abs): 0 x10E3/uL (ref 0.0–0.1)
Immature Granulocytes: 0 %
Lymphocytes Absolute: 2 x10E3/uL (ref 0.7–3.1)
Lymphs: 39 %
MCH: 31 pg (ref 26.6–33.0)
MCHC: 32.3 g/dL (ref 31.5–35.7)
MCV: 96 fL (ref 79–97)
Monocytes Absolute: 0.5 x10E3/uL (ref 0.1–0.9)
Monocytes: 10 %
Neutrophils Absolute: 2.4 x10E3/uL (ref 1.4–7.0)
Neutrophils: 48 %
Platelets: 635 x10E3/uL — ABNORMAL HIGH (ref 150–450)
RBC: 3.1 x10E6/uL — ABNORMAL LOW (ref 3.77–5.28)
RDW: 15.8 % — ABNORMAL HIGH (ref 11.7–15.4)
WBC: 5.2 x10E3/uL (ref 3.4–10.8)

## 2024-01-28 LAB — BASIC METABOLIC PANEL WITH GFR
BUN/Creatinine Ratio: 14 (ref 9–23)
BUN: 15 mg/dL (ref 6–24)
CO2: 21 mmol/L (ref 20–29)
Calcium: 9.4 mg/dL (ref 8.7–10.2)
Chloride: 104 mmol/L (ref 96–106)
Creatinine, Ser: 1.11 mg/dL — ABNORMAL HIGH (ref 0.57–1.00)
Glucose: 123 mg/dL — ABNORMAL HIGH (ref 70–99)
Potassium: 4.4 mmol/L (ref 3.5–5.2)
Sodium: 139 mmol/L (ref 134–144)
eGFR: 64 mL/min/1.73 (ref >59)

## 2024-02-04 ENCOUNTER — Other Ambulatory Visit: Payer: Self-pay | Admitting: Internal Medicine

## 2024-02-04 DIAGNOSIS — E785 Hyperlipidemia, unspecified: Secondary | ICD-10-CM

## 2024-02-09 ENCOUNTER — Ambulatory Visit: Payer: Self-pay | Admitting: Internal Medicine

## 2024-02-18 ENCOUNTER — Other Ambulatory Visit: Payer: Self-pay | Admitting: Internal Medicine

## 2024-02-18 ENCOUNTER — Other Ambulatory Visit (HOSPITAL_COMMUNITY): Payer: Self-pay

## 2024-02-18 ENCOUNTER — Telehealth: Payer: Self-pay | Admitting: Pharmacy Technician

## 2024-02-18 DIAGNOSIS — I10 Essential (primary) hypertension: Secondary | ICD-10-CM

## 2024-02-18 NOTE — Telephone Encounter (Signed)
 Pharmacy Patient Advocate Encounter   Received notification from Patient Pharmacy that prior authorization for Telmisartan -HCTZ 80-12.5MG  tablets is required/requested.   Insurance verification completed.   The patient is insured through [place] MEDICAID.   Per test claim:  SEE BELOW is preferred by the insurance.  If suggested medication is appropriate, Please send in a new RX and discontinue this one. If not, please advise as to why it's not appropriate so that we may request a Prior Authorization. Please note, some preferred medications may still require a PA.  If the suggested medications have not been trialed and there are no contraindications to their use, the PA will not be submitted, as it will not be approved.  She has to have a documented trial/failure or contraindication to two preferred medications.

## 2024-02-21 ENCOUNTER — Other Ambulatory Visit: Payer: Self-pay | Admitting: Internal Medicine

## 2024-02-21 DIAGNOSIS — I10 Essential (primary) hypertension: Secondary | ICD-10-CM

## 2024-02-21 MED ORDER — OLMESARTAN MEDOXOMIL-HCTZ 40-12.5 MG PO TABS: 1.0000 | ORAL_TABLET | Freq: Every day | ORAL | 1 refills | Status: DC

## 2024-02-21 NOTE — Telephone Encounter (Signed)
 Left detailed vm for patient.

## 2024-02-23 ENCOUNTER — Ambulatory Visit: Admitting: Internal Medicine

## 2024-02-23 ENCOUNTER — Other Ambulatory Visit: Payer: Self-pay | Admitting: Internal Medicine

## 2024-02-23 ENCOUNTER — Encounter: Payer: Self-pay | Admitting: Internal Medicine

## 2024-02-23 VITALS — BP 122/81 | HR 109 | Temp 98.1°F | Ht 62.0 in | Wt 193.4 lb

## 2024-02-23 DIAGNOSIS — F1090 Alcohol use, unspecified, uncomplicated: Secondary | ICD-10-CM | POA: Diagnosis not present

## 2024-02-23 DIAGNOSIS — E1169 Type 2 diabetes mellitus with other specified complication: Secondary | ICD-10-CM

## 2024-02-23 DIAGNOSIS — K860 Alcohol-induced chronic pancreatitis: Secondary | ICD-10-CM

## 2024-02-23 DIAGNOSIS — R1013 Epigastric pain: Secondary | ICD-10-CM | POA: Diagnosis not present

## 2024-02-23 DIAGNOSIS — K861 Other chronic pancreatitis: Secondary | ICD-10-CM

## 2024-02-23 DIAGNOSIS — K219 Gastro-esophageal reflux disease without esophagitis: Secondary | ICD-10-CM

## 2024-02-23 MED ORDER — PANTOPRAZOLE SODIUM 40 MG PO TBEC: 40.0000 mg | DELAYED_RELEASE_TABLET | Freq: Two times a day (BID) | ORAL | 3 refills | Status: AC

## 2024-02-23 MED ORDER — SUCRALFATE 1 GM/10ML PO SUSP: 1.0000 g | Freq: Three times a day (TID) | ORAL | 0 refills | Status: AC

## 2024-02-23 MED ORDER — BLOOD GLUCOSE MONITOR KIT: PACK | 0 refills | Status: AC

## 2024-02-23 NOTE — Telephone Encounter (Signed)
 Patient also requesting not on profile: [employer] Platinum Blood Glucose Meter strips

## 2024-02-23 NOTE — Patient Instructions (Signed)
 I am going to check blood work at [facility].  Will call with results.  Increase your pantoprazole  to twice daily.  This medication works best to be taken 30 minutes for breakfast and 30 minutes for dinner.  I sent in refills to your pharmacy.  I am also going to send in liquid Carafate to use up to 4 times a day.  It is important to work on decreasing your alcohol use.  Follow-up in 6 weeks.  It was very nice seeing you today.  Dr. Cindie

## 2024-02-23 NOTE — Progress Notes (Signed)
 Referring Provider: Tobie Suzzane POUR, MD Primary Care Physician:  Bretta Fails, PA Primary GI:  Dr. Cindie  Chief Complaint  Patient presents with   Pancreatitis    HPI:   Tracy Ortiz is a 43 y.o. female who presents to the clinic today for hospital follow-up visit.  She has a history of recurrent pancreatitis, last seen in our office 2022.  Admitted to [facility] 01/17/2024 after presenting with 2 days of severe epigastric pain.  CT imaging demonstrated acute interstitial pancreatitis without evidence of necrosis or infection. She was managed with NPO status, IV fluids, pain control, and antiemetics. RUQ ultrasound showed hepatomegaly and hepatic steatosis but no cholelithiasis or acute cholecystitis. She received counseling regarding alcohol cessation and was started on thiamine and multivitamins.   Is also noted to be anemic with hemoglobin as low as 8.7.  Patient reports she was menstruating at the time.  Notes significant bleeding with her menstrual cycles.  Denies any melena or hematochezia.  Repeat hemoglobin 01/27/24 increase to 9.6.  Recommended oral iron therapy.  She has had multiple admissions for alcohol induced pancreatitis in the past.  First episode approximately 13 years ago.  Endorses chronic alcohol use drinking liquor daily.  She has tried to cut back on this.  Does have history of dyslipidemia with high triglycerides, most recently 126 however.  Today, she states was doing well until 2 days ago when she ate spaghetti which caused significant heartburn and epigastric pain. Took pantoprazole  and Mylanta which relieved her heartburn but still having epigastric pain.  Feels like her pancreas may be flaring again.  No nausea or vomiting.  Able to tolerate Gatorade and water today.      Past Medical History:  Diagnosis Date   HPV (human papilloma virus) infection    Hyperlipidemia    Hypertension    Pancreatitis     No past surgical history on  file.  Current Outpatient Medications  Medication Sig Dispense Refill   blood glucose meter kit and supplies KIT Dispense based on patient and insurance preference. Use up to four times daily as directed. 1 each 0   glipiZIDE  (GLUCOTROL ) 5 MG tablet TAKE 1 TABLET BY MOUTH TWICE DAILY BEFORE A MEAL 60 tablet 0   ipratropium (ATROVENT ) 0.03 % nasal spray Place 2 sprays into both nostrils 2 (two) times daily as needed for rhinitis. 30 mL 0   loratadine (CLARITIN) 10 MG tablet Take 10 mg by mouth daily. As needed     metFORMIN  (GLUCOPHAGE -XR) 500 MG 24 hr tablet Take 2 tablets (1,000 mg total) by mouth 2 (two) times daily with a meal. 360 tablet 1   olmesartan-hydrochlorothiazide (BENICAR HCT) 40-12.5 MG tablet Take 1 tablet by mouth daily. 90 tablet 1   pantoprazole  (PROTONIX ) 40 MG tablet Take 1 tablet (40 mg total) by mouth daily. 90 tablet 1   rosuvastatin  (CRESTOR ) 10 MG tablet Take 1 tablet by mouth once daily 30 tablet 0   No current facility-administered medications for this visit.    Allergies as of 02/23/2024   (No Known Allergies)    Family History  Problem Relation Age of Onset   Other Father        healthy   Diabetes Mother    Polycystic ovary syndrome Sister     Social History   Socioeconomic History   Marital status: Married    Spouse name: Not on file   Number of children: Not on file   Years of education: Not  on file   Highest education level: Not on file  Occupational History   Not on file  Tobacco Use   Smoking status: Every Day   Smokeless tobacco: Never  Substance and Sexual Activity   Alcohol use: Yes    Comment: wine daily   Drug use: Never   Sexual activity: Yes    Birth control/protection: Condom  Other Topics Concern   Not on file  Social History Narrative   Not on file   Social Drivers of Health   Financial Resource Strain: Low Risk  (01/17/2024)   Received from [facility]   Overall Financial Resource Strain (CARDIA)    How hard is  it for you to pay for the very basics like food, housing, medical care, and heating?: Not very hard  Food Insecurity: No Food Insecurity (01/17/2024)   Received from [facility]   Hunger Vital Sign    Within the past 12 months, you worried that your food would run out before you got the money to buy more.: Never true    Within the past 12 months, the food you bought just didn't last and you didn't have money to get more.: Never true  Transportation Needs: No Transportation Needs (01/17/2024)   Received from [facility]   PRAPARE - Transportation    Lack of Transportation (Medical): No    Lack of Transportation (Non-Medical): No  Physical Activity: Insufficiently Active (01/17/2024)   Received from [facility]   Exercise Vital Sign    On average, how many days per week do you engage in moderate to strenuous exercise (like a brisk walk)?: 2 days    On average, how many minutes do you engage in exercise at this level?: 20 min  Stress: No Stress Concern Present (01/17/2024)   Received from [facility] - Occupational Stress Questionnaire    Do you feel stress - tense, restless, nervous, or anxious, or unable to sleep at night because your mind is troubled all the time - these days?: Not at all  Social Connections: Moderately Integrated (01/17/2024)   Received from [facility]   Social Connection and Isolation Panel    Frequency of Communication with Friends and Family: Not on file    How often do you get together with friends or relatives?: More than three times a week    How often do you attend church or religious services?: More than 4 times per year    Do you belong to any clubs or organizations such as church groups, unions, fraternal or athletic groups, or school groups?: No    How often do you attend meetings of the clubs or organizations you belong to?: Never    Are you married, widowed, divorced, separated, never married, or  living with a partner?: Married    Subjective: Review of Systems  Constitutional:  Negative for chills and fever.  HENT:  Negative for congestion and hearing loss.   Eyes:  Negative for blurred vision and double vision.  Respiratory:  Negative for cough and shortness of breath.   Cardiovascular:  Negative for chest pain and palpitations.  Gastrointestinal:  Positive for abdominal pain and heartburn. Negative for blood in stool, constipation, diarrhea, melena and vomiting.  Genitourinary:  Negative for dysuria and urgency.  Musculoskeletal:  Negative for joint pain and myalgias.  Skin:  Negative for itching and rash.  Neurological:  Negative for dizziness and headaches.  Psychiatric/Behavioral:  Negative for depression. The patient is not nervous/anxious.      Objective: BP 122/81 (BP Location: Right Arm, Patient Position: Sitting, Cuff Size: Large)   Pulse (!) 109   Temp 98.1 F (36.7 C) (Oral)   Ht 5' 2 (1.575 m)   Wt 193 lb 6.4 oz (87.7 kg)   LMP  (LMP Unknown)   SpO2 98%   BMI 35.37 kg/m  Physical Exam Constitutional:      Appearance: Normal appearance.  HENT:     Head: Normocephalic and atraumatic.  Eyes:     Extraocular Movements: Extraocular movements intact.     Conjunctiva/sclera: Conjunctivae normal.  Cardiovascular:     Rate and Rhythm: Normal rate and regular rhythm.  Pulmonary:     Effort: Pulmonary effort is normal.     Breath sounds: Normal breath sounds.  Abdominal:     General: Bowel sounds are normal.     Palpations: Abdomen is soft.  Musculoskeletal:        General: No swelling. Normal range of motion.     Cervical back: Normal range of motion and neck supple.  Skin:    General: Skin is warm and dry.     Coloration: Skin is not jaundiced.  Neurological:     General: No focal deficit present.     Mental Status: She is alert and oriented to person, place, and time.  Psychiatric:        Mood and Affect: Mood normal.        Behavior: Behavior  normal.      Assessment: *Recurrent alcohol-induced pancreatitis *Epigastric pain *GERD *Normocytic anemia *Chronic alcohol use  Plan: Discussed in depth with patient today.  Alcohol most likely etiology of her chronic pancreatitis.  Check blood work today including CBC, CMP, lipase.  Continue on clear liquid diet today.  Advance as tolerated.  Increase pantoprazole  to twice daily.  Carafate sent to pharmacy as well.  Counseled if her symptoms continue to worsen she will need to be evaluated in the emergency room and she understands and is agreeable.  Counseled on importance of alcohol cessation.  May need to consider endoscopic evaluation for anemia.  Hold off for now.  Follow-up in 6 weeks.  02/23/2024 1:37 PM

## 2024-02-23 NOTE — Telephone Encounter (Signed)
 Copied from CRM #[id number]. Topic: Clinical - Medication Refill >> Feb 23, 2024  4:07 PM Everette C wrote: Medication: blood glucose meter kit and supplies KIT [[id number]] Reli[employer] Platinum Blood Glucose Meter strips   Has the patient contacted their pharmacy? Yes (Agent: If no, request that the patient contact the pharmacy for the refill. If patient does not wish to contact the pharmacy document the reason why and proceed with request.) (Agent: If yes, when and what did the pharmacy advise?)  This is the patient's preferred pharmacy:  [facility] [address], [place] - [address] [place] [address] Phone: [phone] Fax: [phone]  Is this the correct pharmacy for this prescription? Yes If no, delete pharmacy and type the correct one.   Has the prescription been filled recently? Yes  Is the patient out of the medication? Yes  Has the patient been seen for an appointment in the last year OR does the patient have an upcoming appointment? Yes  Can we respond through MyChart? No  Agent: Please be advised that Rx refills may take up to 3 business days. We ask that you follow-up with your pharmacy.

## 2024-02-24 LAB — COMPREHENSIVE METABOLIC PANEL WITH GFR
AG Ratio: 1.6 (calc) (ref 1.0–2.5)
ALT: 13 U/L (ref 6–29)
AST: 25 U/L (ref 10–30)
Albumin: 4.7 g/dL (ref 3.6–5.1)
Alkaline phosphatase (APISO): 47 U/L (ref 31–125)
BUN/Creatinine Ratio: 12 (calc) (ref 6–22)
BUN: 15 mg/dL (ref 7–25)
CO2: 26 mmol/L (ref 20–32)
Calcium: 9.4 mg/dL (ref 8.6–10.2)
Chloride: 96 mmol/L — ABNORMAL LOW (ref 98–110)
Creat: 1.21 mg/dL — ABNORMAL HIGH (ref 0.50–0.99)
Globulin: 2.9 g/dL (ref 1.9–3.7)
Glucose, Bld: 174 mg/dL — ABNORMAL HIGH (ref 65–139)
Potassium: 3.5 mmol/L (ref 3.5–5.3)
Sodium: 135 mmol/L (ref 135–146)
Total Bilirubin: 0.9 mg/dL (ref 0.2–1.2)
Total Protein: 7.6 g/dL (ref 6.1–8.1)
eGFR: 58 mL/min/1.73m2 — ABNORMAL LOW (ref >=60)

## 2024-02-24 LAB — CBC
HCT: 29.6 % — ABNORMAL LOW (ref 35.0–45.0)
Hemoglobin: 9.8 g/dL — ABNORMAL LOW (ref 11.7–15.5)
MCH: 30 pg (ref 27.0–33.0)
MCHC: 33.1 g/dL (ref 32.0–36.0)
MCV: 90.5 fL (ref 80.0–100.0)
MPV: 9.1 fL (ref 7.5–12.5)
Platelets: 382 Thousand/uL (ref 140–400)
RBC: 3.27 Million/uL — ABNORMAL LOW (ref 3.80–5.10)
RDW: 17.6 % — ABNORMAL HIGH (ref 11.0–15.0)
WBC: 4.8 Thousand/uL (ref 3.8–10.8)

## 2024-02-24 LAB — LIPASE: Lipase: 37 U/L (ref 7–60)

## 2024-03-11 ENCOUNTER — Other Ambulatory Visit: Payer: Self-pay | Admitting: Internal Medicine

## 2024-03-11 DIAGNOSIS — E785 Hyperlipidemia, unspecified: Secondary | ICD-10-CM

## 2024-03-13 ENCOUNTER — Emergency Department (HOSPITAL_COMMUNITY)

## 2024-03-13 ENCOUNTER — Other Ambulatory Visit: Payer: Self-pay

## 2024-03-13 ENCOUNTER — Ambulatory Visit: Payer: Self-pay | Admitting: *Deleted

## 2024-03-13 ENCOUNTER — Emergency Department (HOSPITAL_COMMUNITY)
Admission: EM | Admit: 2024-03-13 | Discharge: 2024-03-13 | Disposition: A | Discharge status: Home / Self Care | Attending: Emergency Medicine | Admitting: Emergency Medicine

## 2024-03-13 ENCOUNTER — Encounter (HOSPITAL_COMMUNITY): Payer: Self-pay | Admitting: Emergency Medicine

## 2024-03-13 DIAGNOSIS — R002 Palpitations: Secondary | ICD-10-CM | POA: Diagnosis not present

## 2024-03-13 DIAGNOSIS — R531 Weakness: Secondary | ICD-10-CM | POA: Insufficient documentation

## 2024-03-13 DIAGNOSIS — R42 Dizziness and giddiness: Secondary | ICD-10-CM | POA: Insufficient documentation

## 2024-03-13 DIAGNOSIS — R0602 Shortness of breath: Secondary | ICD-10-CM | POA: Diagnosis not present

## 2024-03-13 DIAGNOSIS — R1084 Generalized abdominal pain: Secondary | ICD-10-CM | POA: Diagnosis not present

## 2024-03-13 DIAGNOSIS — N2889 Other specified disorders of kidney and ureter: Secondary | ICD-10-CM | POA: Diagnosis not present

## 2024-03-13 HISTORY — DX: Gastritis, unspecified, without bleeding: K29.70

## 2024-03-13 LAB — BASIC METABOLIC PANEL WITH GFR
Anion gap: 18 — ABNORMAL HIGH (ref 5–15)
BUN: 19 mg/dL (ref 6–20)
CO2: 23 mmol/L (ref 22–32)
Calcium: 9.6 mg/dL (ref 8.9–10.3)
Chloride: 93 mmol/L — ABNORMAL LOW (ref 98–111)
Creatinine, Ser: 2.29 mg/dL — ABNORMAL HIGH (ref 0.44–1.00)
GFR, Estimated: 27 mL/min — ABNORMAL LOW (ref >60)
Glucose, Bld: 157 mg/dL — ABNORMAL HIGH (ref 70–99)
Potassium: 3.2 mmol/L — ABNORMAL LOW (ref 3.5–5.1)
Sodium: 134 mmol/L — ABNORMAL LOW (ref 135–145)

## 2024-03-13 LAB — PRO BRAIN NATRIURETIC PEPTIDE: Pro Brain Natriuretic Peptide: 122 pg/mL (ref <300.0)

## 2024-03-13 LAB — HEPATIC FUNCTION PANEL
ALT: 14 U/L (ref 0–44)
AST: 23 U/L (ref 15–41)
Albumin: 4.7 g/dL (ref 3.5–5.0)
Alkaline Phosphatase: 60 U/L (ref 38–126)
Bilirubin, Direct: 0.3 mg/dL — ABNORMAL HIGH (ref 0.0–0.2)
Indirect Bilirubin: 0.4 mg/dL (ref 0.3–0.9)
Total Bilirubin: 0.6 mg/dL (ref 0.0–1.2)
Total Protein: 8.4 g/dL — ABNORMAL HIGH (ref 6.5–8.1)

## 2024-03-13 LAB — CBC
HCT: 35.8 % — ABNORMAL LOW (ref 36.0–46.0)
Hemoglobin: 11.7 g/dL — ABNORMAL LOW (ref 12.0–15.0)
MCH: 29.6 pg (ref 26.0–34.0)
MCHC: 32.7 g/dL (ref 30.0–36.0)
MCV: 90.6 fL (ref 80.0–100.0)
Platelets: 650 K/uL — ABNORMAL HIGH (ref 150–400)
RBC: 3.95 MIL/uL (ref 3.87–5.11)
RDW: 19 % — ABNORMAL HIGH (ref 11.5–15.5)
WBC: 11.6 K/uL — ABNORMAL HIGH (ref 4.0–10.5)
nRBC: 0.3 % — ABNORMAL HIGH (ref 0.0–0.2)

## 2024-03-13 LAB — POC URINE PREG, ED: Preg Test, Ur: NEGATIVE

## 2024-03-13 LAB — LIPASE, BLOOD: Lipase: 57 U/L — ABNORMAL HIGH (ref 11–51)

## 2024-03-13 MED ORDER — LACTATED RINGERS IV BOLUS
1000.0000 mL | Freq: Once | INTRAVENOUS | Status: AC
Start: 2024-03-13 — End: 2024-03-13
  Administered 2024-03-13: 1000 mL via INTRAVENOUS

## 2024-03-13 MED ORDER — ONDANSETRON HCL 4 MG/2ML IJ SOLN: 4.0000 mg | Freq: Once | INTRAMUSCULAR | Status: DC

## 2024-03-13 MED ORDER — LACTATED RINGERS IV BOLUS
1000.0000 mL | Freq: Once | INTRAVENOUS | Status: AC
  Administered 2024-03-13: 1000 mL via INTRAVENOUS

## 2024-03-13 NOTE — Discharge Instructions (Addendum)
 Thank you for visiting the Emergency Department today. It was a pleasure to be part of your healthcare team. You were seen today for generalized weakness.  As discussed, at home, rest, hydrate, and avoid alcohol use. It is important to watch for warning signs such as worsening weakness, fever, chest pain, or trouble breathing. If any of these happen, return to the Emergency Department or call 911. Thank you for trusting us  with your health.

## 2024-03-13 NOTE — ED Provider Notes (Signed)
 [place] EMERGENCY DEPARTMENT AT [facility] Provider Note   CSN: [id number] Arrival date & time: 03/13/24  1410     Patient presents with: Shortness of Breath   Tracy Ortiz is a 43 y.o. female with a history of recent pancreatitis with hospital stay in September, presents to the ED with shortness of breath and abdominal pain that began 3 days ago.  The patient states that for the past 3 days she has been having intermittent palpitations, dizziness, and shortness of breath.  Patient also states that she has been having epigastric pain, which she relates to either gastritis or pancreatitis as she has a history of same and her current symptoms feel similar.  At this time, patient states that she is not having any shortness of breath or dizziness -however given recent history of pancreatitis, patient wanted to be evaluated as she feels like the symptoms are the same as when she was hospitalized.  Patient denies nausea, vomiting, diarrhea, blood in stool, fevers, or urinary difficulty. No recent travel. No sick contacts. The patient's social history is notable for alcohol use of 6-7 shots of liquor per day + occasional beer and wine daily.    Shortness of Breath Associated symptoms: abdominal pain        Prior to Admission medications   Medication Sig Start Date End Date Taking? Authorizing Provider  blood glucose meter kit and supplies KIT Dispense based on patient and insurance preference. Use up to four times daily as directed. 02/23/24   Tobie Suzzane POUR, MD  glipiZIDE  (GLUCOTROL ) 5 MG tablet TAKE 1 TABLET BY MOUTH TWICE DAILY BEFORE A MEAL 11/19/23   Tobie Suzzane POUR, MD  metFORMIN  (GLUCOPHAGE -XR) 500 MG 24 hr tablet Take 2 tablets (1,000 mg total) by mouth 2 (two) times daily with a meal. 11/03/23   Tobie, Suzzane POUR, MD  olmesartan-hydrochlorothiazide (BENICAR HCT) 40-12.5 MG tablet Take 1 tablet by mouth daily. 02/21/24   Tobie Suzzane POUR, MD  pantoprazole  (PROTONIX ) 40 MG  tablet Take 1 tablet (40 mg total) by mouth 2 (two) times daily. 02/23/24 02/22/25  Cindie Carlin POUR, DO  rosuvastatin  (CRESTOR ) 10 MG tablet Take 1 tablet by mouth once daily 03/13/24   Patel, Rutwik K, MD  sucralfate (CARAFATE) 1 GM/10ML suspension Take 10 mLs (1 g total) by mouth 4 (four) times daily -  with meals and at bedtime. 02/23/24   Carver, Charles K, DO    Allergies: Patient has no known allergies.    Review of Systems  Respiratory:  Positive for shortness of breath.   Gastrointestinal:  Positive for abdominal pain.    Updated Vital Signs BP 107/66 (BP Location: Right Arm)   Pulse 77   Temp 98.5 F (36.9 C) (Oral)   Resp 17   Ht 5' 2 (1.575 m)   Wt 87.5 kg   LMP 02/14/2024 (Approximate)   SpO2 99%   BMI 35.30 kg/m      03/13/2024   10:20 PM 03/13/2024    8:03 PM 03/13/2024    6:52 PM  Vitals with BMI  Systolic 107 107 892  Diastolic 66 66 70  Pulse 77 78     Orthostatic VS for the past 72 hrs (Last 3 readings):  BP Location  03/13/24 2220 Right Arm  03/13/24 2003 Right Arm  03/13/24 1852 Right Arm    Physical Exam Vitals and nursing note reviewed.  Constitutional:      General: She is not in acute distress.  Appearance: Normal appearance.  HENT:     Head: Normocephalic and atraumatic.  Eyes:     Extraocular Movements: Extraocular movements intact.     Conjunctiva/sclera: Conjunctivae normal.     Pupils: Pupils are equal, round, and reactive to light.  Cardiovascular:     Rate and Rhythm: Normal rate and regular rhythm.     Pulses: Normal pulses.  Pulmonary:     Effort: Pulmonary effort is normal. No respiratory distress.     Breath sounds: Normal breath sounds.     Comments: Patient has no difficulty speaking complete sentences. Abdominal:     General: Abdomen is flat.     Palpations: Abdomen is soft.     Tenderness: There is generalized abdominal tenderness and tenderness in the epigastric area.     Comments: TTP epigastric region.  No  guarding, rebound, mildly distended. The patient notes on exam that she does not normally have abdominal distention.  Musculoskeletal:        General: Normal range of motion.     Cervical back: Normal range of motion.  Skin:    General: Skin is warm and dry.     Capillary Refill: Capillary refill takes less than 2 seconds.  Neurological:     General: No focal deficit present.     Mental Status: She is alert. Mental status is at baseline.  Psychiatric:        Mood and Affect: Mood normal.     (all labs ordered are listed, but only abnormal results are displayed) Labs Reviewed  BASIC METABOLIC PANEL WITH GFR - Abnormal; Notable for the following components:      Result Value   Sodium 134 (*)    Potassium 3.2 (*)    Chloride 93 (*)    Glucose, Bld 157 (*)    Creatinine, Ser 2.29 (*)    GFR, Estimated 27 (*)    Anion gap 18 (*)    All other components within normal limits  CBC - Abnormal; Notable for the following components:   WBC 11.6 (*)    Hemoglobin 11.7 (*)    HCT 35.8 (*)    RDW 19.0 (*)    Platelets 650 (*)    nRBC 0.3 (*)    All other components within normal limits  LIPASE, BLOOD - Abnormal; Notable for the following components:   Lipase 57 (*)    All other components within normal limits  HEPATIC FUNCTION PANEL - Abnormal; Notable for the following components:   Total Protein 8.4 (*)    Bilirubin, Direct 0.3 (*)    All other components within normal limits  PRO BRAIN NATRIURETIC PEPTIDE  POC URINE PREG, ED    EKG: EKG Interpretation Date/Time:  Monday March 13 2024 17:57:50 EST Ventricular Rate:  82 PR Interval:  152 QRS Duration:  84 QT Interval:  408 QTC Calculation: 476 R Axis:   59  Text Interpretation: Normal sinus rhythm with sinus arrhythmia Normal ECG When compared with ECG of 13-Mar-2024 14:19, Vent. rate has decreased BY  43 BPM Confirmed by Tracy Sharper [id number]) on 03/13/2024 6:12:04 PM  Radiology: CT ABDOMEN PELVIS WO CONTRAST Result  Date: 03/13/2024 CLINICAL DATA:  Shortness of breath with palpitations and dizziness. EXAM: CT ABDOMEN AND PELVIS WITHOUT CONTRAST TECHNIQUE: Multidetector CT imaging of the abdomen and pelvis was performed following the standard protocol without IV contrast. RADIATION DOSE REDUCTION: This exam was performed according to the departmental dose-optimization program which includes automated exposure control, adjustment of the mA  and/or kV according to patient size and/or use of iterative reconstruction technique. COMPARISON:  None Available. FINDINGS: Lower chest: No acute abnormality. Hepatobiliary: No focal liver abnormality is seen. No gallstones, gallbladder wall thickening, or biliary dilatation. Pancreas: Unremarkable. No pancreatic ductal dilatation or surrounding inflammatory changes. Spleen: Normal in size without focal abnormality. Adrenals/Urinary Tract: Adrenal glands are unremarkable. Kidneys are normal in size, without renal calculi or hydronephrosis. A 2.6 cm diameter well-defined exophytic complex, hemorrhagic or proteinaceous left renal cyst is seen. The urinary bladder is empty and subsequently limited in evaluation. Stomach/Bowel: Stomach is within normal limits. Appendix appears normal. No evidence of bowel wall thickening, distention, or inflammatory changes. Vascular/Lymphatic: No significant vascular findings are present. No enlarged abdominal or pelvic lymph nodes. Reproductive: The uterus and right adnexa are unremarkable. A 4.0 cm left adnexal cyst is noted. Other: No abdominal wall hernia or abnormality. No abdominopelvic ascites. Musculoskeletal: No acute or significant osseous findings. IMPRESSION: 1. 4.0 cm left adnexal cyst, likely ovarian in origin. Correlation with nonemergent pelvic ultrasound is recommended. 2. 2.6 cm diameter hemorrhagic or proteinaceous left renal cyst, confirmed on prior MRI abdomen dated Sep 24, 2022. This recommendation follows ACR consensus guidelines:  Management of the Incidental Renal Mass on CT: A White Paper of the ACR Incidental Findings Committee. J Am Coll Radiol [phone]. Electronically Signed   By: Suzen Dials M.D.   On: 03/13/2024 18:29     Procedures   Medications Ordered in the ED  lactated ringers bolus 1,000 mL (0 mLs Intravenous Stopped 03/13/24 1749)  lactated ringers bolus 1,000 mL (0 mLs Intravenous Stopped 03/13/24 1926)                                    Medical Decision Making Amount and/or Complexity of Data Reviewed Labs: ordered. Radiology: ordered.  Risk Prescription drug management.   Patient presents to the ED for concern of shortness of breath and abdominal pain x 3 days, this involves an extensive number of treatment options, and is a complaint that carries with it a high risk of complications and morbidity.    The differential diagnosis includes: Infectious etiology Metabolic etiology PE  Co morbidities that complicate the patient evaluation: Chronic alcohol use - 6-7 shots of liquor per day Pancreatitis Hypertension  Additional history obtained: Additional history obtained from Outside Medical Records and Past Admission  External records from outside source obtained and reviewed including history, surgical history, medications, allergies. The patient was a reliable historian, providing a clear, detailed, and consistent account of the presenting symptoms and relevant medical history. The information was obtained directly from the patient  and statements were documented in the patient's own words when possible. No discrepancies were noted between the history provided and available collateral sources.     Lab Tests: I ordered, and personally interpreted labs.   The pertinent results include:  CBC - WBC 11.6, platelets 650 - platelets were 635 4 weeks ago  BMP - creatinine 2.29 - last reported 1.21 3 weeks ago Lipase - 57, increased from 37 3 weeks ago Hepatic function panel -  mildly elevated bilirubin BNP - 122  Imaging Studies ordered: I ordered imaging studies including: DG chest 2 view CT abdomen pelvis without contrast I independently visualized and interpreted imaging which showed: DG chest 2 view: The heart size and mediastinal contours are within normal limits. No acute infiltrate, pleural effusion or pneumothorax is identified. The  visualized skeletal structures are unremarkable. 4.0 cm left adnexal cyst, likely ovarian in origin. Correlation with nonemergent pelvic ultrasound is recommended. 2.6 cm diameter hemorrhagic or proteinaceous left renal cyst, confirmed on prior MRI abdomen dated Sep 24, 2022. This recommendation follows ACR consensus guidelines: Management of the Incidental Renal Mass on CT: A White Paper of the ACR Incidental Findings Committee. I agree with the radiologist interpretation  Cardiac Monitoring: The patient was maintained on a cardiac monitor. Repeat EKGs were obtained during this visit given patient's hypotensive episodes. I personally viewed and interpreted the cardiac monitored which showed an underlying rhythm of: Initial EKG showed sinus tachycardia, during visit patient EKG showed sinus rhythm -both with no acute findings.  Medicines ordered and prescription drug management: I ordered medications: Lactated ringer bolus 1000 mL for hydration Lactated ringer bolus 1000 mL for hypotension Reevaluation of the patient after these medicines showed that the patient improved I have reviewed the patients home medicines and have made adjustments as needed  Test Considered: Diagnostic testing was considered based on the patient's presenting symptoms, risk factors, and initial clinical assessment.  D-dimer study was considered given patient's 3-day history of shortness of breath, however patient did not complain of any chest pain, hemoptysis, unilateral leg swelling, recent immobilization, syncope, or other symptoms concerning for PE  at this time.  Clinical presentation and exam are overall low risk, and no additional PE risk factors were identified.  Given the low pretest probability, and discussions with Dr. Towana, D-dimer testing was not obtained at this time. Geneva Score (Revised) for Pulmonary Embolism from [email or web address] on 03/15/2024 RESULT SUMMARY: 3 points Low risk group: 7-9% incidence of PE from several studies. INPUTS: Age >65 --> 0 = No Previous DVT or PE --> 0 = No Surgery (under general anesthesia) or lower limb fracture in past month --> 0 = No Active malignant condition --> 0 = No Unilateral lower limb pain --> 0 = No Hemoptysis --> 0 = No Heart rate --> 3 = 75-94 Pain on lower limb palpation and unilateral edema --> 0 = No The approach to diagnostic testing prioritized exclusion of life-threatening conditions  Problem List: Generalized weakness Dehydration Abdominal pain Emergency Department Course: The patient presented with generalized weakness and abdominal pain x 3 days. Initial assessment included history, physical exam, and review of prior medical records.  Laboratory studies were obtained including CBC and CMP for infectious or metabolic etiologies -CBC showed mild leukocytosis, platelets were moderately elevated-however patient's platelets were also moderately elevated 1 month ago at.  BMP showed a creatinine of 2.29 which is significantly elevated since last reported 1.21 3 weeks ago.  Lipase was mildly elevated at 57 and BNP was obtained given patient's abdominal swelling which she states was new - no acute findings.  Imaging was obtained, chest x-ray showed no acute findings and CT abdomen pelvis without contrast showed a 4.0 cm adnexal ovarian cyst and a 2.6 cm hemorrhagic renal cyst. IV fluids were given for possible patient dehydration. Patient had a single episode of hypotension upon standing during visit.  Another bolus was administered for patient hypotension. Patient denied any nausea or  pain during visit. Orthostatics were obtained and found to be unremarkable.  Patient was monitored during visit with no acute changes.  During discharge discussion, shared decision making was was utilized-patient stated that she felt better and felt like she could go home.  The patient and I discussed the importance of hydration and avoiding alcohol use.   Reevaluation: After the  interventions noted above, I reevaluated the patient and found that they have :improved. Patient denies dizziness or SOB at the time of reevaluation before discharge.   Dispostion: After consideration of the results and the patients response to treatment, I feel that the patent would benefit from discharge home with close follow-up with her PCP for further evaluation and care. Patient was informed of having her creatinine rechecked by Friday.  Clinical Assessment:    Working diagnosis: Generalized weakness Disposition Plan: The patient is medically stable for discharge from the Emergency Department at this time. Vital signs are within normal limits, and the patient is alert, oriented, and in no acute distress. Evaluation has been completed with no findings necessitating hospital admission or further emergent workup.  Communication:   Patient informed of disposition decision and rationale. Questions addressed.  The results and clinical impression were discussed with the patient at bedside and the patient demonstrated understanding.     Final diagnoses:  Generalized weakness    ED Discharge Orders     None          Tracy Ortiz, Tracy Ortiz 03/15/24 1439    Tracy Ozell BROCKS, MD 03/15/24 [phone]

## 2024-03-13 NOTE — ED Notes (Signed)
   03/13/24 1855  Orthostatic Lying   BP- Lying 100/60  Pulse- Lying 84  Orthostatic Sitting  BP- Sitting 109/66  Pulse- Sitting 93  Orthostatic Standing at 0 minutes  BP- Standing at 0 minutes 99/67  Pulse- Standing at 0 minutes 92  Orthostatic Standing at 3 minutes  BP- Standing at 3 minutes 103/74  Pulse- Standing at 3 minutes 100

## 2024-03-13 NOTE — ED Notes (Signed)
Pt up ambulating independently with steady gait.

## 2024-03-13 NOTE — Telephone Encounter (Signed)
   FYI Only or Action Required?: FYI only for provider: ED advised.  Patient was last seen in primary care on 01/27/2024 by Tracy Suzzane POUR, MD.  Called Nurse Triage reporting Dizziness.  Symptoms began several days ago.  Interventions attempted: Rest, hydration, or home remedies.  Symptoms are: gradually worsening.  Triage Disposition: Go to ED Now (or PCP Triage)  Patient/caregiver understands and will follow disposition?: Yes           Copied from CRM [id number]. Topic: Clinical - Red Word Triage >> Mar 13, 2024 12:59 PM Zebedee SAUNDERS wrote: Red Word that prompted transfer to Nurse Triage: Pt is light headed when stand up walking, since the last 3 days. Reason for Disposition  Patient sounds very sick or weak to the triager  Answer Assessment - Initial Assessment Questions No available appt with in 4 hours. Recommended ED due to palpitations yesterday and SOB today and feelings of passing out at times.         1. DESCRIPTION: Describe your dizziness.     lightheaded 2. LIGHTHEADED: Do you feel lightheaded? (e.g., somewhat faint, woozy, weak upon standing)     Weak upon standing 3. VERTIGO: Do you feel like either you or the room is spinning or tilting? (i.e., vertigo)     Na  4. SEVERITY: How bad is it?  Do you feel like you are going to faint? Can you stand and walk?     When walking dog today things got bright.  5. ONSET:  When did the dizziness begin?     3 days  6. AGGRAVATING FACTORS: Does anything make it worse? (e.g., standing, change in head position)     Standing and bending over. Today walking causing issues. 7. HEART RATE: Can you tell me your heart rate? How many beats in 15 seconds?  (Note: Not all patients can do this.)       Not sure  8. CAUSE: What do you think is causing the dizziness? (e.g., decreased fluids or food, diarrhea, emotional distress, heat exposure, new medicine, sudden standing, vomiting; unknown)     Recent  gastritis, not eating much. Abdominal pain .  9. RECURRENT SYMPTOM: Have you had dizziness before? If Yes, ask: When was the last time? What happened that time?     Yes 3 days ago  10. OTHER SYMPTOMS: Do you have any other symptoms? (e.g., fever, chest pain, vomiting, diarrhea, bleeding)       SOB today with walking to porch . No chest pain at this time. Felt palpitations in heart yesterday patient is a smoker .  11. PREGNANCY: Is there any chance you are pregnant? When was your last menstrual period?       na  Protocols used: Dizziness - Lightheadedness-A-AH

## 2024-03-13 NOTE — ED Triage Notes (Signed)
 Pt reports feeling SOB, palpitations, dizziness after walking her dog today, intermittent periods of same x 3 days

## 2024-03-14 ENCOUNTER — Telehealth: Payer: Self-pay

## 2024-03-14 NOTE — Telephone Encounter (Signed)
 Spouse verbally confirmed appt

## 2024-03-14 NOTE — Telephone Encounter (Signed)
 Scheduled and left a vm with patient for hfu Friday 21 at 9:40am

## 2024-03-14 NOTE — Telephone Encounter (Signed)
 Copied from CRM [id number]. Topic: Appointments - Scheduling Inquiry for Clinic >> Mar 14, 2024 12:52 PM Corin V wrote: Reason for CRM: Patient called to schedule an emergency room follow up. The emergency room advised she be seen by the end of the week. There is no availability for a hospital follow up before 04/19/24. Emergency room recommended she have kidney level and blood pressure rechecked. Callback: [phone]

## 2024-03-17 ENCOUNTER — Encounter: Payer: Self-pay | Admitting: Internal Medicine

## 2024-03-17 ENCOUNTER — Ambulatory Visit (INDEPENDENT_AMBULATORY_CARE_PROVIDER_SITE_OTHER): Admitting: Internal Medicine

## 2024-03-17 VITALS — BP 128/86 | HR 106 | Ht 62.0 in | Wt 194.4 lb

## 2024-03-17 DIAGNOSIS — N179 Acute kidney failure, unspecified: Secondary | ICD-10-CM | POA: Diagnosis not present

## 2024-03-17 DIAGNOSIS — I10 Essential (primary) hypertension: Secondary | ICD-10-CM | POA: Diagnosis not present

## 2024-03-17 DIAGNOSIS — Z09 Encounter for follow-up examination after completed treatment for conditions other than malignant neoplasm: Secondary | ICD-10-CM

## 2024-03-17 DIAGNOSIS — F10288 Alcohol dependence with other alcohol-induced disorder: Secondary | ICD-10-CM

## 2024-03-17 DIAGNOSIS — N83202 Unspecified ovarian cyst, left side: Secondary | ICD-10-CM | POA: Insufficient documentation

## 2024-03-17 NOTE — Assessment & Plan Note (Signed)
 CT abdomen showed incidental left-sided adnexal cyst likely ovarian Plan to get US  pelvis in the next visit

## 2024-03-17 NOTE — Assessment & Plan Note (Addendum)
 BP Readings from Last 1 Encounters:  03/17/24 128/86   Well-controlled, DC olmesartan -HCTZ due to hypotensive spells If BP is elevated in the next visit, will start olmesartan  only HTN also likely due to alcohol dependence, needs to cut down Counseled for compliance with the medications Advised DASH diet and moderate exercise/walking, at least 150 mins/week

## 2024-03-17 NOTE — Assessment & Plan Note (Signed)
 ER chart reviewed including imaging and blood tests AKI due to dehydration, symptoms improved with hydration

## 2024-03-17 NOTE — Assessment & Plan Note (Signed)
 Used to take 3-5 drinks of vodka every day, has been cutting down H/o recurrent pancreatitis Advised to quit alcohol use - support group information has been provided in the past Advised to take thiamine 100 mg QD and folic acid 500 mcg QD

## 2024-03-17 NOTE — Progress Notes (Signed)
 Established Patient Office Visit  Subjective:  Patient ID: Tracy Ortiz, female    DOB: 13-May-1980  Age: 43 y.o. MRN: [id number]  CC:  Chief Complaint  Patient presents with   Hospitalization Follow-up    Needs kidney level and blood pressure rechecked.     HPI Tracy Ortiz is a 43 y.o. female with past medical history of hypertension, sarcoidosis, hyperlipidemia, alcohol and tobacco abuse who presents for f/u of recent ER visit on 03/13/24.  She went to ER for complaint of generalized weakness, visual disturbance and dizzy spells on the day of ER visit.  She reported that she had alcoholic beverages the day prior to the ER visit.  She was having nausea and epigastric pain, and had not eaten properly for about a day.  CMP showed AKI, hypokalemia and slightly elevated lipase.  She was given IV fluids in the ER.  She also reports having hypotensive spells recently, and has stopped taking olmesartan -HCTZ since the ER visit.  Her BP is WNL today.   Recurrent pancreatitis: She has had multiple episodes of pancreatitis, likely due to alcohol dependence.  She has been trying to avoid alcohol since the last visit, but admits that she had few drinks in the last week. She acknowledges that she needs to cut down.  She currently denies any abdominal pain, nausea or vomiting. She has had GI evaluation for it.      Past Medical History:  Diagnosis Date   Gastritis    HPV (human papilloma virus) infection    Hyperlipidemia    Hypertension    Pancreatitis     History reviewed. No pertinent surgical history.  Family History  Problem Relation Age of Onset   Other Father        healthy   Diabetes Mother    Polycystic ovary syndrome Sister     Social History   Socioeconomic History   Marital status: Married    Spouse name: Not on file   Number of children: Not on file   Years of education: Not on file   Highest education level: Not on file  Occupational History   Not on file   Tobacco Use   Smoking status: Every Day   Smokeless tobacco: Never  Vaping Use   Vaping status: Never Used  Substance and Sexual Activity   Alcohol use: Yes    Comment: wine daily   Drug use: Never   Sexual activity: Yes    Birth control/protection: Condom  Other Topics Concern   Not on file  Social History Narrative   Not on file   Social Drivers of Health   Financial Resource Strain: Low Risk (01/17/2024)   Received from [facility]   Overall Financial Resource Strain (CARDIA)    How hard is it for you to pay for the very basics like food, housing, medical care, and heating?: Not very hard  Food Insecurity: No Food Insecurity (01/17/2024)   Received from [facility]   Hunger Vital Sign    Within the past 12 months, you worried that your food would run out before you got the money to buy more.: Never true    Within the past 12 months, the food you bought just didn't last and you didn't have money to get more.: Never true  Transportation Needs: No Transportation Needs (01/17/2024)   Received from [facility]   PRAPARE - Transportation    Lack of Transportation (Medical): No    Lack of Transportation (  Non-Medical): No  Physical Activity: Insufficiently Active (01/17/2024)   Received from [facility]   Exercise Vital Sign    On average, how many days per week do you engage in moderate to strenuous exercise (like a brisk walk)?: 2 days    On average, how many minutes do you engage in exercise at this level?: 20 min  Stress: No Stress Concern Present (01/17/2024)   Received from [facility] - Occupational Stress Questionnaire    Do you feel stress - tense, restless, nervous, or anxious, or unable to sleep at night because your mind is troubled all the time - these days?: Not at all  Social Connections: Moderately Integrated (01/17/2024)   Received from [facility]   Social Connection and Isolation Panel     Frequency of Communication with Friends and Family: Not on file    How often do you get together with friends or relatives?: More than three times a week    How often do you attend church or religious services?: More than 4 times per year    Do you belong to any clubs or organizations such as church groups, unions, fraternal or athletic groups, or school groups?: No    How often do you attend meetings of the clubs or organizations you belong to?: Never    Are you married, widowed, divorced, separated, never married, or living with a partner?: Married  Intimate Partner Violence: Not At Risk (01/17/2024)   Received from [facility]   Humiliation, Afraid, Rape, and Kick questionnaire    Within the last year, have you been afraid of your partner or ex-partner?: No    Within the last year, have you been humiliated or emotionally abused in other ways by your partner or ex-partner?: No    Within the last year, have you been kicked, hit, slapped, or otherwise physically hurt by your partner or ex-partner?: No    Within the last year, have you been raped or forced to have any kind of sexual activity by your partner or ex-partner?: No    Outpatient Medications Prior to Visit  Medication Sig Dispense Refill   blood glucose meter kit and supplies KIT Dispense based on patient and insurance preference. Use up to four times daily as directed. 1 each 0   glipiZIDE  (GLUCOTROL ) 5 MG tablet TAKE 1 TABLET BY MOUTH TWICE DAILY BEFORE A MEAL 60 tablet 0   metFORMIN  (GLUCOPHAGE -XR) 500 MG 24 hr tablet Take 2 tablets (1,000 mg total) by mouth 2 (two) times daily with a meal. 360 tablet 1   pantoprazole  (PROTONIX ) 40 MG tablet Take 1 tablet (40 mg total) by mouth 2 (two) times daily. 180 tablet 3   rosuvastatin  (CRESTOR ) 10 MG tablet Take 1 tablet by mouth once daily 30 tablet 0   sucralfate  (CARAFATE ) 1 GM/10ML suspension Take 10 mLs (1 g total) by mouth 4 (four) times daily -  with meals and at bedtime. 420 mL 0    olmesartan -hydrochlorothiazide (BENICAR  HCT) 40-12.5 MG tablet Take 1 tablet by mouth daily. (Patient not taking: Reported on 03/17/2024) 90 tablet 1   No facility-administered medications prior to visit.    No Known Allergies  ROS Review of Systems  Constitutional:  Negative for chills and fever.  HENT:  Negative for congestion, sinus pressure, sinus pain and sore throat.   Eyes:  Negative for pain and discharge.  Respiratory:  Negative for cough and shortness of breath.  Cardiovascular:  Positive for palpitations. Negative for chest pain.  Gastrointestinal:  Negative for abdominal pain, diarrhea, nausea and vomiting.  Endocrine: Negative for polydipsia and polyuria.  Genitourinary:  Negative for dysuria, hematuria, vaginal discharge and vaginal pain.  Musculoskeletal:  Negative for neck pain and neck stiffness.  Skin:  Negative for rash.  Neurological:  Negative for dizziness and weakness.  Psychiatric/Behavioral:  Negative for agitation and behavioral problems.       Objective:    Physical Exam Vitals reviewed.  Constitutional:      General: She is not in acute distress.    Appearance: She is obese. She is not diaphoretic.  HENT:     Head: Normocephalic and atraumatic.     Nose: Nose normal.     Mouth/Throat:     Mouth: Mucous membranes are moist.  Eyes:     General: No scleral icterus.    Extraocular Movements: Extraocular movements intact.  Cardiovascular:     Rate and Rhythm: Normal rate and regular rhythm.     Heart sounds: Normal heart sounds. No murmur heard. Pulmonary:     Breath sounds: Normal breath sounds. No wheezing or rales.  Musculoskeletal:     Cervical back: Neck supple. No tenderness.     Right lower leg: No edema.     Left lower leg: No edema.  Skin:    General: Skin is warm.     Findings: No rash.  Neurological:     General: No focal deficit present.     Mental Status: She is alert and oriented to person, place, and time.     Sensory: No  sensory deficit.     Motor: No weakness.  Psychiatric:        Mood and Affect: Mood normal.        Behavior: Behavior normal.     BP 128/86 (BP Location: Left Arm)   Pulse (!) 106   Ht 5' 2 (1.575 m)   Wt 194 lb 6.4 oz (88.2 kg)   LMP 02/14/2024 (Approximate)   SpO2 100%   BMI 35.56 kg/m  Wt Readings from Last 3 Encounters:  03/17/24 194 lb 6.4 oz (88.2 kg)  03/13/24 193 lb (87.5 kg)  02/23/24 193 lb 6.4 oz (87.7 kg)    Lab Results  Component Value Date   TSH 1.800 02/01/2023   Lab Results  Component Value Date   WBC 11.6 (H) 03/13/2024   HGB 11.7 (L) 03/13/2024   HCT 35.8 (L) 03/13/2024   MCV 90.6 03/13/2024   PLT 650 (H) 03/13/2024   Lab Results  Component Value Date   NA 134 (L) 03/13/2024   K 3.2 (L) 03/13/2024   CO2 23 03/13/2024   GLUCOSE 157 (H) 03/13/2024   BUN 19 03/13/2024   CREATININE 2.29 (H) 03/13/2024   BILITOT 0.6 03/13/2024   ALKPHOS 60 03/13/2024   AST 23 03/13/2024   ALT 14 03/13/2024   PROT 8.4 (H) 03/13/2024   ALBUMIN 4.7 03/13/2024   CALCIUM  9.6 03/13/2024   ANIONGAP 18 (H) 03/13/2024   EGFR 58 (L) 02/23/2024   Lab Results  Component Value Date   CHOL 131 06/09/2023   Lab Results  Component Value Date   HDL 42 06/09/2023   Lab Results  Component Value Date   LDLCALC 36 06/09/2023   Lab Results  Component Value Date   TRIG 362 (H) 06/09/2023   Lab Results  Component Value Date   CHOLHDL 3.1 06/09/2023   Lab Results  Component  Value Date   HGBA1C 6.0 01/18/2024      Assessment & Plan:   Problem List Items Addressed This Visit       Cardiovascular and Mediastinum   Primary hypertension   BP Readings from Last 1 Encounters:  03/17/24 128/86   Well-controlled, DC olmesartan -HCTZ due to hypotensive spells If BP is elevated in the next visit, will start olmesartan  only HTN also likely due to alcohol dependence, needs to cut down Counseled for compliance with the medications Advised DASH diet and moderate  exercise/walking, at least 150 mins/week      Relevant Orders   CMP14+EGFR   CBC with Differential/Platelet     Endocrine   Left ovarian cyst   CT abdomen showed incidental left-sided adnexal cyst likely ovarian Plan to get US  pelvis in the next visit        Genitourinary   AKI (acute kidney injury)   Likely due to dehydration DC olmesartan -HCTZ due to hypotensive spells Advised to maintain at least 64 ounces of water intake in a day and eat at regular intervals Needs to avoid alcohol use Check CMP today      Relevant Orders   CMP14+EGFR     Other   Alcohol dependence (HCC)   Used to take 3-5 drinks of vodka every day, has been cutting down H/o recurrent pancreatitis Advised to quit alcohol use - support group information has been provided in the past Advised to take thiamine 100 mg QD and folic acid 500 mcg QD      Encounter for examination following treatment at hospital - Primary   ER chart reviewed including imaging and blood tests AKI due to dehydration, symptoms improved with hydration      Relevant Orders   CMP14+EGFR   CBC with Differential/Platelet        No orders of the defined types were placed in this encounter.   Follow-up: Return if symptoms worsen or fail to improve.    Suzzane MARLA Blanch, MD

## 2024-03-17 NOTE — Assessment & Plan Note (Addendum)
 Likely due to dehydration DC olmesartan -HCTZ due to hypotensive spells Advised to maintain at least 64 ounces of water intake in a day and eat at regular intervals Needs to avoid alcohol use Check CMP today

## 2024-03-17 NOTE — Patient Instructions (Addendum)
 Please stop taking Olmesartan -HCTZ.  Please continue to take other medications as prescribed.  Please continue to follow low carb diet and perform moderate exercise/walking at least 150 mins/week.

## 2024-03-18 LAB — CBC WITH DIFFERENTIAL/PLATELET
Basophils Absolute: 0 x10E3/uL (ref 0.0–0.2)
Basos: 1 %
EOS (ABSOLUTE): 0.1 x10E3/uL (ref 0.0–0.4)
Eos: 1 %
Hematocrit: 29.6 % — ABNORMAL LOW (ref 34.0–46.6)
Hemoglobin: 9.4 g/dL — ABNORMAL LOW (ref 11.1–15.9)
Immature Grans (Abs): 0.1 x10E3/uL (ref 0.0–0.1)
Immature Granulocytes: 1 %
Lymphocytes Absolute: 2.1 x10E3/uL (ref 0.7–3.1)
Lymphs: 29 %
MCH: 29.8 pg (ref 26.6–33.0)
MCHC: 31.8 g/dL (ref 31.5–35.7)
MCV: 94 fL (ref 79–97)
Monocytes Absolute: 0.8 x10E3/uL (ref 0.1–0.9)
Monocytes: 11 %
Neutrophils Absolute: 4.1 x10E3/uL (ref 1.4–7.0)
Neutrophils: 57 %
Platelets: 425 x10E3/uL (ref 150–450)
RBC: 3.15 x10E6/uL — ABNORMAL LOW (ref 3.77–5.28)
RDW: 19.2 % — ABNORMAL HIGH (ref 11.7–15.4)
WBC: 7.1 x10E3/uL (ref 3.4–10.8)

## 2024-03-18 LAB — CMP14+EGFR
ALT: 10 IU/L (ref 0–32)
AST: 15 IU/L (ref 0–40)
Albumin: 4.1 g/dL (ref 3.9–4.9)
Alkaline Phosphatase: 47 IU/L (ref 41–116)
BUN/Creatinine Ratio: 11 (ref 9–23)
BUN: 12 mg/dL (ref 6–24)
Bilirubin Total: <0.2 mg/dL (ref 0.0–1.2)
CO2: 23 mmol/L (ref 20–29)
Calcium: 9.8 mg/dL (ref 8.7–10.2)
Chloride: 101 mmol/L (ref 96–106)
Creatinine, Ser: 1.1 mg/dL — ABNORMAL HIGH (ref 0.57–1.00)
Globulin, Total: 2.5 g/dL (ref 1.5–4.5)
Glucose: 132 mg/dL — ABNORMAL HIGH (ref 70–99)
Potassium: 4.2 mmol/L (ref 3.5–5.2)
Sodium: 138 mmol/L (ref 134–144)
Total Protein: 6.6 g/dL (ref 6.0–8.5)
eGFR: 65 mL/min/1.73 (ref >59)

## 2024-03-20 ENCOUNTER — Ambulatory Visit: Payer: Self-pay | Admitting: Internal Medicine

## 2024-04-07 ENCOUNTER — Other Ambulatory Visit: Payer: Self-pay | Admitting: Internal Medicine

## 2024-04-07 DIAGNOSIS — E785 Hyperlipidemia, unspecified: Secondary | ICD-10-CM

## 2024-04-10 ENCOUNTER — Ambulatory Visit: Payer: Self-pay

## 2024-04-10 ENCOUNTER — Other Ambulatory Visit: Payer: Self-pay | Admitting: Internal Medicine

## 2024-04-10 ENCOUNTER — Encounter: Payer: Self-pay | Admitting: Internal Medicine

## 2024-04-10 DIAGNOSIS — I10 Essential (primary) hypertension: Secondary | ICD-10-CM

## 2024-04-10 NOTE — Telephone Encounter (Signed)
 Call disconnected prior to transfer to NT. This RN made first outbound attempt to triage patient. No answer, LVM. Routing for additional attempts.   Copied from CRM #[id number]. Topic: Clinical - Red Word Triage >> Apr 10, 2024  2:44 PM Tracy Ortiz wrote: Red Word that prompted transfer to Nurse Triage: Patient was advised by Dr. Tobie to let him know if her blood pressure was elevated after being off the medication due to low blood pressure numbers. It has been over 140/90 for 3 days now.   Symptoms: Finger is tingling.   Pharmacy: [facility] [address] [place] - [address] [place] [address] Phone: [phone] Fax: [phone] Hours: Not open 24 hours

## 2024-04-10 NOTE — Telephone Encounter (Signed)
 FYI Only or Action Required?: Action required by provider: clinical question for provider, update on patient condition, and refused appointment.  Patient was last seen in primary care on 03/17/2024 by Tracy Suzzane POUR, MD.  Called Nurse Triage reporting Hypertension and Tingling.  Symptoms began several days ago.  Interventions attempted: Nothing.  Symptoms are: unchanged.  Triage Disposition: See PCP Within 2 Weeks  Patient/caregiver understands and will follow disposition?: No, wishes to speak with PCP    Copied from CRM #[id number]. Topic: Clinical - Red Word Triage >> Apr 10, 2024  3:05 PM Tiffini S wrote: [facility] that prompted transfer to Nurse Triage: Patient blood pressure reading was 133/89 today- said the blood pressure has been over 40/90 for 3 days now  Symptoms: Finger is tingling  Was holding for NT and call was disconnected Reason for Disposition  [1] Systolic BP >= 130 OR Diastolic >= 80 AND [2] not taking BP medications  Answer Assessment - Initial Assessment Questions Additional info: Previously had been on antihypertensives but was discontinued due to hypotension, she was advised by pcp to monitor blood pressures and let him know if she has 3 consecutive elevated readings. Calling today to let Dr. Tobie know her BP is elevated, would he start new medication or would he like her to take half tab of previous medication.  She declines office visit. Please advise.    1. BLOOD PRESSURE: What is your blood pressure? Did you take at least two measurements 5 minutes apart?     133/89 today.  2. ONSET: When did you take your blood pressure?     3 days elevated. Desending order 140/88, 141/94, 149/91 3. HOW: How did you take your blood pressure? (e.g., automatic home BP monitor, visiting nurse)     Home auto 4. HISTORY: Do you have a history of high blood pressure?     Yes 5. MEDICINES: Are you taking any medicines for blood pressure? Have you missed any  doses recently?     Not taking any at this time 6. OTHER SYMPTOMS: Do you have any symptoms? (e.g., blurred vision, chest pain, difficulty breathing, headache, weakness)     Tingling right hand thumb for few weeks. Sugars this morning 87, few minutes ago 100 7. PREGNANCY: Is there any chance you are pregnant? When was your last menstrual period?  Protocols used: Blood Pressure - High-A-AH

## 2024-04-11 ENCOUNTER — Other Ambulatory Visit: Payer: Self-pay | Admitting: Internal Medicine

## 2024-04-11 ENCOUNTER — Ambulatory Visit: Payer: Self-pay | Admitting: *Deleted

## 2024-04-11 DIAGNOSIS — I10 Essential (primary) hypertension: Secondary | ICD-10-CM

## 2024-04-11 MED ORDER — OLMESARTAN MEDOXOMIL 20 MG PO TABS: 10.0000 mg | ORAL_TABLET | Freq: Every day | ORAL | 0 refills | Status: AC

## 2024-04-11 NOTE — Telephone Encounter (Signed)
 FYI Only or Action Required?: Action required by provider: update on patient condition.  Patient was last seen in primary care on 03/17/2024 by Tobie Suzzane POUR, MD.  Called Nurse Triage reporting Hypotension.  Symptoms began several days ago.  Interventions attempted: Prescription medications: BP medication dosing chnage.  Symptoms are: can not get stablized.  Triage Disposition: See Physician Within 24 Hours  Patient/caregiver understands and will follow disposition?: declines appointment with other provider- wants PCP  Copied from CRM #[id number]. Topic: Clinical - Red Word Triage >> Apr 11, 2024  8:11 AM Tracy Ortiz wrote: [facility] that prompted transfer to Nurse Triage: Patient states called yesterday and BP was running high, per the Dr order starting taking half tablet of olmesartan -hydrochlorothiazide. States she took her BP this morning and it was 90/65. Is concerned with how low it is. Reason for Disposition  [1] Systolic BP 90-110 AND [2] taking blood pressure medications AND [3] NOT feeling weak or lightheaded  Answer Assessment - Initial Assessment Questions Patient has not taken her BP medication today- wants to know if she needs to take it- declines to schedule appointment with another provider- wants to hear from PCP.  1. BLOOD PRESSURE: What is your blood pressure? Did you take at least two measurements 5 minutes apart?     91/62(last night), 90/65, 100/63 P105-(today) patient has not taken medication today 2. ONSET: When did you take your blood pressure?     Last night , today 3. HOW: How did you take your blood pressure? (e.g., visiting nurse, automatic home BP monitor)     Automatic cuff- arm 4. HISTORY: Do you have a history of low blood pressure? What is your blood pressure normally?     High BP- patient is having adjustments to her medication  5. MEDICINES: Are you taking any medicines for blood pressure? If Yes, ask: Have they been changed  recently?     Yes, taking: olmesartan -hydrochlorothiazide (BENICAR  HCT) 40-12.5 MG tablet, half pill- yesterday ( patient had been off medication completely- due to elevated BP readings- patient was told to take half pill- and now her BP is low) 6. PULSE RATE: Do you know what your pulse rate is?      121, 113- patient reports her pulse was in 90's with elevated BP 7. OTHER SYMPTOMS: Have you been sick recently? Have you had a recent injury?     No- patient reports she has been taking sudafed for last 2-3 days- but high BP was previous to taking  Protocols used: Blood Pressure - Low-A-AH

## 2024-04-11 NOTE — Telephone Encounter (Signed)
 Mychart message sent since unable to speak to patient

## 2024-05-03 ENCOUNTER — Ambulatory Visit: Payer: Self-pay

## 2024-05-03 ENCOUNTER — Telehealth: Payer: Self-pay | Admitting: Internal Medicine

## 2024-05-03 NOTE — Telephone Encounter (Signed)
 FYI Only or Action Required?: Action required by provider: clinical question for provider and update on patient condition.  Patient was last seen in primary care on 03/17/2024 by Tracy Suzzane POUR, MD.  Called Nurse Triage reporting Sinusitis.  Symptoms began a week ago.  Interventions attempted: OTC medications: afrin; sudafed and Prescription medications: flonase .  Symptoms are: gradually improving.  Triage Disposition: See PCP When Office is Open (Within 3 Days)  Patient/caregiver understands and will follow disposition?: No, wishes to speak with PCP    Message from Shanda MATSU sent at 05/03/2024  3:17 PM EST  Reason for Triage: Patient is reporting her head feeling light but heavy at the same time as well as blood when she blows her nose.      Reason for Disposition  [1] Sinus congestion (pressure, fullness) AND [2] present > 10 days  Answer Assessment - Initial Assessment Questions Returned call to f/u on symptoms. Pt states she has had sinus congestion and pain x 1 week. Pt reports symptoms have improved some with afrin and sudafed. Pt states she had flonase  left over from provider but it was expired; pt states she has been using it but is out now. Discussed appt with provider but pt just started a new job and unable to keep appt for tomorrow. Pt requested refill of Flonase  to [facility]. Please advise.     1. LOCATION: Where does it hurt?      Facial; forehead, under eyes and behind ears   2. ONSET: When did the sinus pain start?  (e.g., hours, days)      1 week ago   3. SEVERITY: How bad is the pain?   (Scale 0-10; or none, mild, moderate or severe)     Moderate   4. RECURRENT SYMPTOM: Have you ever had sinus problems before? If Yes, ask: When was the last time? and What happened that time?      Yes; pt reports using expired flonase  which has helped some   5. NASAL CONGESTION: Is the nose blocked? If Yes, ask: Can you open it or must you breathe through your  mouth?     No; nasal drainage but able to breath   6. NASAL DISCHARGE: Do you have discharge from your nose? If so ask, What color?     Yes; clear, yellow   7. FEVER: Do you have a fever? If Yes, ask: What is it, how was it measured, and when did it start?      No   8. OTHER SYMPTOMS: Do you have any other symptoms? (e.g., sore throat, cough, earache, difficulty breathing)     No  Protocols used: Sinus Pain or Congestion-A-AH

## 2024-05-03 NOTE — Telephone Encounter (Unsigned)
 Copied from CRM #[id number]. Topic: Clinical - Medication Refill >> May 03, 2024  3:13 PM Shanda MATSU wrote: Medication: fluticasone  (FLONASE ) 50 MCG/ACT nasal spray  Has the patient contacted their pharmacy? No (Agent: If no, request that the patient contact the pharmacy for the refill. If patient does not wish to contact the pharmacy document the reason why and proceed with request.) (Agent: If yes, when and what did the pharmacy advise?)  This is the patient's preferred pharmacy:  [facility] [address] [place] - [address] [place] [address] Phone: [phone] Fax: [phone]  Is this the correct pharmacy for this prescription? Yes If no, delete pharmacy and type the correct one.   Has the prescription been filled recently? No  Is the patient out of the medication? Yes  Has the patient been seen for an appointment in the last year OR does the patient have an upcoming appointment? Yes  Can we respond through MyChart? Yes  Agent: Please be advised that Rx refills may take up to 3 business days. We ask that you follow-up with your pharmacy.

## 2024-05-03 NOTE — Telephone Encounter (Signed)
 Attempted to contact pt but NA and unable to LM. Attempt #1    Message from Shanda MATSU sent at 05/03/2024  3:17 PM EST  Reason for Triage: Patient is reporting her head feeling light but heavy at the same time as well as blood when she blows her nose.

## 2024-05-04 ENCOUNTER — Ambulatory Visit: Admitting: Internal Medicine

## 2024-05-04 ENCOUNTER — Encounter: Payer: Self-pay | Admitting: Internal Medicine

## 2024-05-04 ENCOUNTER — Telehealth: Admitting: Internal Medicine

## 2024-05-04 ENCOUNTER — Other Ambulatory Visit: Payer: Self-pay | Admitting: Internal Medicine

## 2024-05-04 DIAGNOSIS — N83202 Unspecified ovarian cyst, left side: Secondary | ICD-10-CM

## 2024-05-04 DIAGNOSIS — E785 Hyperlipidemia, unspecified: Secondary | ICD-10-CM

## 2024-05-04 DIAGNOSIS — J0100 Acute maxillary sinusitis, unspecified: Secondary | ICD-10-CM | POA: Diagnosis not present

## 2024-05-04 MED ORDER — FLUTICASONE PROPIONATE 50 MCG/ACT NA SUSP: 2.0000 | Freq: Every day | NASAL | 2 refills | Status: AC

## 2024-05-04 MED ORDER — AMOXICILLIN-POT CLAVULANATE 875-125 MG PO TABS: 1.0000 | ORAL_TABLET | Freq: Two times a day (BID) | ORAL | 0 refills | Status: AC

## 2024-05-04 NOTE — Progress Notes (Signed)
 "    Virtual Visit via Video Note   Because of Ekta Binning's co-morbid illnesses, she is at least at moderate risk for complications without adequate follow up.  This format is felt to be most appropriate for this patient at this time.  All issues noted in this document were discussed and addressed.  A limited physical exam was performed with this format.      Evaluation Performed:  Follow-up visit  Date:  05/04/2024   ID:  Tracy Ortiz, DOB 02/21/1981, MRN [id number]  Patient Location: Home Provider Location: Office/Clinic  Participants: Patient Location of Patient: Home Location of Provider: Telehealth Consent was obtain for visit to be over via telehealth. I verified that I am speaking with the correct person using two identifiers.  PCP:  Tobie Suzzane POUR, MD   Chief Complaint: Nasal congestion  History of Present Illness:    Tracy Ortiz is a 44 y.o. female who has a video visit for complaint of nasal congestion, postnasal drip, sinus pressure related headache and bilateral ear pressure for the last 2 weeks.  She has tried taking OTC decongestants without much relief.  Denies any fever, chills, dyspnea or wheezing.  The patient does not have symptoms concerning for COVID-19 infection (fever, chills, cough, or new shortness of breath).   Past Medical, Surgical, Social History, Allergies, and Medications have been Reviewed.  Past Medical History:  Diagnosis Date   Gastritis    HPV (human papilloma virus) infection    Hyperlipidemia    Hypertension    Pancreatitis    History reviewed. No pertinent surgical history.   Active Medications[1]   Allergies:   Patient has no known allergies.   ROS:   Please see the history of present illness. All other systems reviewed and are negative.   Labs/Other Tests and Data Reviewed:    Recent Labs: 03/13/2024: Pro Brain Natriuretic Peptide 122.0 03/17/2024: ALT 10; BUN 12; Creatinine, Ser 1.10; Hemoglobin 9.4; Platelets  425; Potassium 4.2; Sodium 138   Recent Lipid Panel Lab Results  Component Value Date/Time   CHOL 131 06/09/2023 03:13 PM   TRIG 362 (H) 06/09/2023 03:13 PM   HDL 42 06/09/2023 03:13 PM   CHOLHDL 3.1 06/09/2023 03:13 PM   LDLCALC 36 06/09/2023 03:13 PM    Wt Readings from Last 3 Encounters:  03/17/24 194 lb 6.4 oz (88.2 kg)  03/13/24 193 lb (87.5 kg)  02/23/24 193 lb 6.4 oz (87.7 kg)     Objective:    Vital Signs:  There were no vitals taken for this visit.   VITAL SIGNS:  reviewed GEN:  no acute distress EYES:  sclerae anicteric, EOMI - Extraocular Movements Intact RESPIRATORY:  normal respiratory effort, symmetric expansion NEURO:  alert and oriented x 3, no obvious focal deficit PSYCH:  normal affect  ASSESSMENT & PLAN:    Assessment & Plan Acute non-recurrent maxillary sinusitis Sinus pain and pressure with nasal congestion Started Augmentin  due to persistent symptoms despite symptomatic treatment Flonase  nasal spray PRN for nasal congestion Left ovarian cyst CT abdomen (11/25) showed incidental left-sided adnexal cyst likely ovarian Ordered US  pelvis - agrees to get it done now    I discussed the assessment and treatment plan with the patient. The patient was provided an opportunity to ask questions, and all were answered. The patient agreed with the plan and demonstrated an understanding of the instructions.   The patient was advised to call back or seek an in-person evaluation if the symptoms worsen or if the  condition fails to improve as anticipated.  The above assessment and management plan was discussed with the patient. The patient verbalized understanding of and has agreed to the management plan.   Medication Adjustments/Labs and Tests Ordered: Current medicines are reviewed at length with the patient today.  Concerns regarding medicines are outlined above.   Tests Ordered: No orders of the defined types were placed in this encounter.   Medication  Changes: No orders of the defined types were placed in this encounter.    Note: This dictation was prepared with Dragon dictation along with smaller phrase technology. Similar sounding words can be transcribed inadequately or may not be corrected upon review. Any transcriptional errors that result from this process are unintentional.      Disposition:  Follow up  Signed, Suzzane MARLA Blanch, MD  05/04/2024 3:06 PM     [facility]     [1]  Current Meds  Medication Sig   blood glucose meter kit and supplies KIT Dispense based on patient and insurance preference. Use up to four times daily as directed.   glipiZIDE  (GLUCOTROL ) 5 MG tablet TAKE 1 TABLET BY MOUTH TWICE DAILY BEFORE A MEAL   metFORMIN  (GLUCOPHAGE -XR) 500 MG 24 hr tablet Take 2 tablets (1,000 mg total) by mouth 2 (two) times daily with a meal.   olmesartan  (BENICAR ) 20 MG tablet Take 0.5 tablets (10 mg total) by mouth daily.   pantoprazole  (PROTONIX ) 40 MG tablet Take 1 tablet (40 mg total) by mouth 2 (two) times daily.   rosuvastatin  (CRESTOR ) 10 MG tablet Take 1 tablet by mouth once daily   sucralfate  (CARAFATE ) 1 GM/10ML suspension Take 10 mLs (1 g total) by mouth 4 (four) times daily -  with meals and at bedtime.   "

## 2024-05-04 NOTE — Telephone Encounter (Signed)
 Scheduled appt for this afternoon for video

## 2024-05-04 NOTE — Patient Instructions (Signed)
 Please start taking Augmentin  as prescribed.  Please use Flonase  for nasal congestion/allergies.  Please use vaporizer and/or sinus inhaler as needed for nasal congestion.

## 2024-05-04 NOTE — Telephone Encounter (Signed)
 fluticasone  (FLONASE ) 50 MCG/ACT nasal spray -- not on active med list

## 2024-05-04 NOTE — Assessment & Plan Note (Addendum)
 Sinus pain and pressure with nasal congestion Started Augmentin  due to persistent symptoms despite symptomatic treatment Flonase  nasal spray PRN for nasal congestion

## 2024-05-04 NOTE — Assessment & Plan Note (Addendum)
 CT abdomen (11/25) showed incidental left-sided adnexal cyst likely ovarian Ordered US  pelvis - agrees to get it done now

## 2024-05-10 ENCOUNTER — Telehealth: Payer: Self-pay

## 2024-05-10 NOTE — Telephone Encounter (Signed)
 No call from clinical side only thing I see coming up is an US  on 01/16

## 2024-05-10 NOTE — Telephone Encounter (Signed)
 Copied from CRM [id number]. Topic: General - Other >> May 10, 2024  1:37 PM Delon DASEN wrote: Reason for CRM: returning call to office - please call and leave message- [phone]

## 2024-05-12 ENCOUNTER — Ambulatory Visit (HOSPITAL_COMMUNITY)
Admission: RE | Admit: 2024-05-12 | Discharge: 2024-05-12 | Disposition: A | Discharge status: Home / Self Care | Source: Ambulatory Visit | Attending: Internal Medicine | Admitting: Internal Medicine

## 2024-05-12 DIAGNOSIS — N83202 Unspecified ovarian cyst, left side: Secondary | ICD-10-CM | POA: Diagnosis present

## 2024-05-15 ENCOUNTER — Ambulatory Visit: Payer: Self-pay | Admitting: Internal Medicine

## 2024-07-13 ENCOUNTER — Ambulatory Visit: Payer: Self-pay | Admitting: Internal Medicine
